# Patient Record
Sex: Female | Born: 1980 | Hispanic: No | Marital: Single | Smoking: Never smoker
Health system: Southern US, Community
[De-identification: ages and names within clinical notes are randomized; demographics above are authoritative.]

## PROBLEM LIST (undated history)

## (undated) DIAGNOSIS — D649 Anemia, unspecified: Secondary | ICD-10-CM

---

## 2014-12-17 ENCOUNTER — Ambulatory Visit: Payer: Self-pay

## 2015-02-26 ENCOUNTER — Ambulatory Visit: Payer: Self-pay | Attending: Family Medicine | Admitting: Family Medicine

## 2015-02-26 ENCOUNTER — Encounter: Payer: Self-pay | Admitting: Family Medicine

## 2015-02-26 VITALS — BP 99/62 | HR 81 | Temp 98.0°F | Resp 16 | Ht 62.5 in | Wt 152.0 lb

## 2015-02-26 DIAGNOSIS — R51 Headache: Secondary | ICD-10-CM | POA: Insufficient documentation

## 2015-02-26 DIAGNOSIS — E559 Vitamin D deficiency, unspecified: Secondary | ICD-10-CM | POA: Insufficient documentation

## 2015-02-26 DIAGNOSIS — R102 Pelvic and perineal pain: Secondary | ICD-10-CM | POA: Insufficient documentation

## 2015-02-26 DIAGNOSIS — N926 Irregular menstruation, unspecified: Secondary | ICD-10-CM | POA: Insufficient documentation

## 2015-02-26 DIAGNOSIS — J3489 Other specified disorders of nose and nasal sinuses: Secondary | ICD-10-CM | POA: Insufficient documentation

## 2015-02-26 DIAGNOSIS — Z124 Encounter for screening for malignant neoplasm of cervix: Secondary | ICD-10-CM

## 2015-02-26 DIAGNOSIS — Z975 Presence of (intrauterine) contraceptive device: Secondary | ICD-10-CM | POA: Insufficient documentation

## 2015-02-26 DIAGNOSIS — Z01419 Encounter for gynecological examination (general) (routine) without abnormal findings: Secondary | ICD-10-CM | POA: Insufficient documentation

## 2015-02-26 DIAGNOSIS — R42 Dizziness and giddiness: Secondary | ICD-10-CM | POA: Insufficient documentation

## 2015-02-26 LAB — POCT URINALYSIS DIPSTICK
Bilirubin, UA: NEGATIVE
Glucose, UA: NEGATIVE
Ketones, UA: NEGATIVE
LEUKOCYTES UA: NEGATIVE
NITRITE UA: NEGATIVE
PH UA: 6.5
PROTEIN UA: NEGATIVE
Spec Grav, UA: 1.025
UROBILINOGEN UA: 0.2

## 2015-02-26 LAB — CBC
HCT: 42.3 % (ref 36.0–46.0)
Hemoglobin: 15 g/dL (ref 12.0–15.0)
MCH: 30.5 pg (ref 26.0–34.0)
MCHC: 35.5 g/dL (ref 30.0–36.0)
MCV: 86.2 fL (ref 78.0–100.0)
MPV: 11.3 fL (ref 8.6–12.4)
PLATELETS: 248 10*3/uL (ref 150–400)
RBC: 4.91 MIL/uL (ref 3.87–5.11)
RDW: 13.4 % (ref 11.5–15.5)
WBC: 10.4 10*3/uL (ref 4.0–10.5)

## 2015-02-26 LAB — POCT URINE PREGNANCY: PREG TEST UR: NEGATIVE

## 2015-02-26 LAB — POCT GLYCOSYLATED HEMOGLOBIN (HGB A1C): HEMOGLOBIN A1C: 5.4

## 2015-02-26 NOTE — Progress Notes (Signed)
Patient ID: Kayla Stevenson, female   DOB: Mar 26, 1981, 34 y.o.   MRN: [id number]   Subjective:  Patient ID: Kayla Stevenson, female    DOB: Mar 26, 1981  Age: 34 y.o. MRN: [id number] Spanish interpreter used  CC: Establish Care   HPI Kayla Stevenson presents for    1. Dizzy: started two weeks ago. Intermittent dizziness. Last felt dizzy two days ago. Dizziness last a couple of minutes. She has no history of anemia.   2. Menstrual irregularities: since IUD placement. Present for two years. spotting's comes and goes. It is light. It last 3 days at a time. Has slight pelvic pain at times as well.   3. Dry nose with trouble breathing: comes and goes when she eats. Makes her sniff and sniffle. No abdominal pain or CP. No reflux.   4. Headache: L sided. Most days. Moderate pain. ? Migraines. No treatment.   History reviewed. No pertinent past medical history.  History reviewed. No pertinent past surgical history.  Family History  Problem Relation Age of Onset  . Depression Mother     Social History  Substance Use Topics  . Smoking status: Never Smoker   . Smokeless tobacco: Not on file  . Alcohol Use: No    ROS Review of Systems  Constitutional: Negative for fever and chills.  Eyes: Negative for visual disturbance.  Respiratory: Positive for shortness of breath.   Cardiovascular: Negative for chest pain.  Gastrointestinal: Negative for abdominal pain and blood in stool.  Genitourinary: Positive for menstrual problem and pelvic pain.  Musculoskeletal: Negative for back pain and arthralgias.  Skin: Negative for rash.  Allergic/Immunologic: Negative for immunocompromised state.  Neurological: Positive for dizziness and headaches.  Hematological: Negative for adenopathy. Does not bruise/bleed easily.  Psychiatric/Behavioral: Negative for suicidal ideas and dysphoric mood.    Objective:   Today's Vitals: BP 99/62 mmHg  Pulse 81  Temp(Src) 98 F (36.7 C) (Oral)  Resp 16  Ht  5' 2.5" (1.588 m)  Wt 152 lb (68.947 kg)  BMI 27.34 kg/m2  SpO2 98%  LMP 01/12/2015 Orthostatic VS for the past 24 hrs:  BP- Lying Pulse- Lying BP- Sitting Pulse- Sitting BP- Standing at 0 minutes Pulse- Standing at 0 minutes  02/26/15 1655 110/70 mmHg 65 115/76 mmHg 75 120/83 mmHg 76    Physical Exam  Constitutional: She is oriented to person, place, and time. She appears well-developed and well-nourished. No distress.  HENT:  Head: Normocephalic and atraumatic.  Cardiovascular: Normal rate, regular rhythm, normal heart sounds and intact distal pulses.   Pulmonary/Chest: Effort normal and breath sounds normal.  Genitourinary: Vagina normal and uterus normal. Pelvic exam was performed with patient prone. There is no rash, tenderness or lesion on the right labia. There is no rash, tenderness or lesion on the left labia. Cervix exhibits no motion tenderness, no discharge and no friability.  IUD strings visualized   Musculoskeletal: She exhibits no edema.  Lymphadenopathy:       Right: No inguinal adenopathy present.       Left: No inguinal adenopathy present.  Neurological: She is alert and oriented to person, place, and time.  Skin: Skin is warm and dry. No rash noted.  Psychiatric: She has a normal mood and affect.   Normal UA and negative U preg  Assessment & Plan:   Kayla Stevenson was seen today for establish care.  Diagnoses and all orders for this visit:  Dizzy -     CBC -     Vitamin D,  25-hydroxy -     HgB A1c  Pelvic pain in female -     POCT urinalysis dipstick -     POCT urine pregnancy  Pap smear for cervical cancer screening -     Cytology - PAP [place]     No outpatient encounter prescriptions on file as of 02/26/2015.   No facility-administered encounter medications on file as of 02/26/2015.    Follow-up: No Follow-up on file.    Dessa Phi MD

## 2015-02-26 NOTE — Patient Instructions (Addendum)
Verlee MonteDora was seen today for establish care.  Diagnoses and all orders for this visit:  Dizzy -     CBC -     Vitamin D, 25-hydroxy -     HgB A1c  Pelvic pain in female -     POCT urinalysis dipstick -     POCT urine pregnancy  Pap smear for cervical cancer screening -     Cytology - PAP    You will be called with results   F/u in 3 months for dizziness. F/u sooner if you would like to change birth control   Dr. Armen PickupFunches

## 2015-02-26 NOTE — Progress Notes (Signed)
Establish Care  C/C Dizziness. Stated after eating feeling SHOB, low abdominal pain  Pain scale 6 No tobacco user  No suicide thought in the past two weeks

## 2015-02-27 ENCOUNTER — Telehealth: Payer: Self-pay | Admitting: *Deleted

## 2015-02-27 DIAGNOSIS — E559 Vitamin D deficiency, unspecified: Secondary | ICD-10-CM | POA: Insufficient documentation

## 2015-02-27 LAB — VITAMIN D 25 HYDROXY (VIT D DEFICIENCY, FRACTURES): Vit D, 25-Hydroxy: 12 ng/mL — ABNORMAL LOW (ref 30–100)

## 2015-02-27 MED ORDER — VITAMIN D (ERGOCALCIFEROL) 1.25 MG (50000 UNIT) PO CAPS: 50000.0000 [IU] | ORAL_CAPSULE | ORAL | Status: DC

## 2015-02-27 NOTE — Addendum Note (Signed)
Addended by: Dessa PhiFUNCHES, [place] on: 02/27/2015 09:08 AM   Modules accepted: Orders

## 2015-02-27 NOTE — Telephone Encounter (Signed)
-----   Message from Dessa PhiJosalyn Funches, MD sent at 02/27/2015  9:07 AM EST ----- Normal CBC Vit D deficiency will replete

## 2015-02-27 NOTE — Telephone Encounter (Signed)
Date of birth verified by pt Lab results given Normal CBC Vit D deficiency  Rx Vit D send to [facility]  Pt verbalized understanding  Information given in Spanish

## 2015-02-28 LAB — CERVICOVAGINAL ANCILLARY ONLY
Chlamydia: NEGATIVE
NEISSERIA GONORRHEA: NEGATIVE
Wet Prep (BD Affirm): NEGATIVE

## 2015-02-28 LAB — CYTOLOGY - PAP

## 2015-03-06 ENCOUNTER — Telehealth: Payer: Self-pay | Admitting: *Deleted

## 2015-03-06 NOTE — Telephone Encounter (Signed)
-----   Message from Dessa PhiJosalyn Funches, MD sent at 03/01/2015  3:48 PM EST ----- Negative pap Repeat in 3 years

## 2015-03-06 NOTE — Telephone Encounter (Signed)
-----   Message from Josalyn Funches, MD sent at 02/28/2015  9:04 AM EST ----- Negative GC/chlamydia 

## 2015-03-06 NOTE — Telephone Encounter (Signed)
Date of birth verified by pt  Results given  Normal pap smear repeat in 3 years Negative CG/Chlamydia and negative wep prep Pt verbalized understanding  Results given in Spanish

## 2015-03-06 NOTE — Telephone Encounter (Signed)
-----   Message from Dessa PhiJosalyn Funches, MD sent at 02/28/2015  8:51 AM EST ----- Negative wet prep

## 2015-05-29 ENCOUNTER — Ambulatory Visit: Payer: Self-pay | Attending: Family Medicine

## 2015-10-04 ENCOUNTER — Encounter (HOSPITAL_COMMUNITY): Payer: Self-pay | Admitting: *Deleted

## 2015-10-07 ENCOUNTER — Other Ambulatory Visit (HOSPITAL_COMMUNITY): Payer: Self-pay | Admitting: *Deleted

## 2015-10-07 DIAGNOSIS — N6320 Unspecified lump in the left breast, unspecified quadrant: Secondary | ICD-10-CM

## 2015-10-31 ENCOUNTER — Other Ambulatory Visit (HOSPITAL_COMMUNITY): Payer: Self-pay | Admitting: *Deleted

## 2015-10-31 ENCOUNTER — Ambulatory Visit
Admission: RE | Admit: 2015-10-31 | Discharge: 2015-10-31 | Disposition: A | Discharge status: Home / Self Care | Payer: No Typology Code available for payment source | Source: Ambulatory Visit | Attending: Obstetrics and Gynecology | Admitting: Obstetrics and Gynecology

## 2015-10-31 ENCOUNTER — Encounter (HOSPITAL_COMMUNITY): Payer: Self-pay

## 2015-10-31 ENCOUNTER — Ambulatory Visit (HOSPITAL_COMMUNITY)
Admission: RE | Admit: 2015-10-31 | Discharge: 2015-10-31 | Disposition: A | Discharge status: Home / Self Care | Payer: Self-pay | Source: Ambulatory Visit | Attending: Obstetrics and Gynecology | Admitting: Obstetrics and Gynecology

## 2015-10-31 ENCOUNTER — Other Ambulatory Visit (HOSPITAL_COMMUNITY): Payer: Self-pay | Admitting: Obstetrics and Gynecology

## 2015-10-31 ENCOUNTER — Ambulatory Visit
Admission: RE | Admit: 2015-10-31 | Discharge: 2015-10-31 | Disposition: A | Discharge status: Home / Self Care | Payer: Self-pay | Source: Ambulatory Visit | Attending: Obstetrics and Gynecology | Admitting: Obstetrics and Gynecology

## 2015-10-31 VITALS — BP 110/64 | Temp 97.9°F | Ht 63.0 in | Wt 150.2 lb

## 2015-10-31 DIAGNOSIS — N6320 Unspecified lump in the left breast, unspecified quadrant: Secondary | ICD-10-CM

## 2015-10-31 DIAGNOSIS — N6325 Unspecified lump in the left breast, overlapping quadrants: Secondary | ICD-10-CM

## 2015-10-31 DIAGNOSIS — N6452 Nipple discharge: Secondary | ICD-10-CM

## 2015-10-31 DIAGNOSIS — Z1239 Encounter for other screening for malignant neoplasm of breast: Secondary | ICD-10-CM

## 2015-10-31 HISTORY — DX: Anemia, unspecified: D64.9

## 2015-10-31 NOTE — Patient Instructions (Signed)
Explained breast self awareness to Kayla Stevenson. Let patient know that she did not need a Pap smear today due to last Pap smear and HPV typing was 02/26/2015. Let her know BCCCP will cover Pap smears and HPV typing every 5 years unless has a history of abnormal Pap smears. Referred patient to the [facility] for diagnostic mammogram. Appointment scheduled for Thursday, October 31, 2015 at 1530. Let patient know will follow up with her within the next couple weeks with results of breast discharge by phone. Kayla Stevenson verbalized understanding.  Jin Shockley, Kathaleen Maser, RN 4:17 PM

## 2015-10-31 NOTE — Progress Notes (Signed)
Complaints of left breast pain that comes and goes x 8 months. Patient rates pain at a 8 out of 10. Patient complained of bilateral milky breast discharge x 3 years that at times is spontaneous.   Pap Smear: Pap smear not completed today. Last Pap smear was 02/26/2015 at the [facility] that was normal with negative HPV. Per patient has no history of an abnormal Pap smear.  Physical exam: Breasts Breasts symmetrical. No skin abnormalities bilateral breasts. No nipple retraction bilateral breasts. Bilateral milky colored breast discharge. Sample of bilateral breast discharge sent to cytology. No lymphadenopathy. No lumps palpated right breast. Palpated a pea sized left breast lump at 3 o'clock 4 cm from the nipple. No complaints of pain or tenderness on exam. Referred patient to the [facility] for diagnostic mammogram. Appointment scheduled for Thursday, October 31, 2015 at 1530.   Pelvic/Bimanual No Pap smear completed today since last Pap smear and HPV typing was 02/26/2015. Pap smear not indicated per BCCCP guidelines.   Smoking History: Patient has never smoked.  Patient Navigation: Patient education provided. Access to services provided for patient through [facility] program. Spanish interpreter provided.  Used Spanish interpreter Melissa from the Breast Center.

## 2015-11-04 ENCOUNTER — Encounter (HOSPITAL_COMMUNITY): Payer: Self-pay | Admitting: *Deleted

## 2016-02-03 ENCOUNTER — Ambulatory Visit: Payer: No Typology Code available for payment source | Admitting: Family Medicine

## 2016-04-02 ENCOUNTER — Encounter: Payer: Self-pay | Admitting: Physician Assistant

## 2016-04-02 ENCOUNTER — Ambulatory Visit: Payer: Self-pay | Attending: Family Medicine | Admitting: Physician Assistant

## 2016-04-02 VITALS — BP 109/67 | HR 76 | Temp 97.9°F | Resp 16 | Wt 149.4 lb

## 2016-04-02 DIAGNOSIS — D649 Anemia, unspecified: Secondary | ICD-10-CM | POA: Insufficient documentation

## 2016-04-02 DIAGNOSIS — R42 Dizziness and giddiness: Secondary | ICD-10-CM | POA: Insufficient documentation

## 2016-04-02 DIAGNOSIS — R110 Nausea: Secondary | ICD-10-CM | POA: Insufficient documentation

## 2016-04-02 DIAGNOSIS — Z79899 Other long term (current) drug therapy: Secondary | ICD-10-CM | POA: Insufficient documentation

## 2016-04-02 MED ORDER — PROMETHAZINE HCL 25 MG PO TABS
ORAL_TABLET | ORAL | 0 refills | Status: DC
Start: 2016-04-02 — End: 2017-06-18

## 2016-04-02 MED ORDER — FLUTICASONE PROPIONATE 50 MCG/ACT NA SUSP: 2.0000 | Freq: Every day | NASAL | 6 refills | Status: DC

## 2016-04-02 MED FILL — PROMETHAZINE 25 MG TABLET: 25 | 4 days supply | Qty: 20 | Fill #0

## 2016-04-02 MED FILL — FLUTICASONE PROP 50 MCG SPR: 50 | 30 days supply | Qty: 16 | Fill #0

## 2016-04-02 NOTE — Progress Notes (Signed)
Kayla Stevenson, is a 36 y.o. female  RUE:[id number]  JYN:[id number]  DOB - [birth year]  Subjective:  Chief Complaint and HPI: Kayla Stevenson is a 36 y.o. female here today for 2-3 day h/o dizziness, nausea, nasal congestion, and slight cough.  She had a few loose stools a few days ago and that has resolved.   Her baby is also sick.  She denies abdominal pain, urinary s/sx. She is on her period now. No vision changes.  [employer] interpreters used.    ROS:   Constitutional:  No f/c, No night sweats, No unexplained weight loss. EENT:  No vision changes, No blurry vision, No hearing changes. +sinus congestion Respiratory: Mild cough, No SOB Cardiac: No CP, no palpitations GI:  No abd pain, + nausea w/o vomiting, some loose BM that have resolved.  GU: No Urinary s/sx Musculoskeletal: No joint pain Neuro: No headache, + dizziness, no motor weakness.  Skin: No rash Endocrine:  No polydipsia. No polyuria.  Psych: Denies SI/HI  No problems updated.  ALLERGIES: No Known Allergies  PAST MEDICAL HISTORY: Past Medical History:  Diagnosis Date  . Anemia     MEDICATIONS AT HOME: Prior to Admission medications   Medication Sig Start Date End Date Taking? Authorizing Provider  levonorgestrel (MIRENA) 20 MCG/24HR IUD 1 each by Intrauterine route once.   Yes Historical Provider, MD  fluticasone (FLONASE) 50 MCG/ACT nasal spray Place 2 sprays into both nostrils daily. 04/02/16   Anders SimmondsAngela M Anaisabel Pederson, PA-C  promethazine (PHENERGAN) 25 MG tablet 1/2-1 tablet every 4-6 hours for nausea or dizziness 04/02/16   Anders SimmondsAngela M Jackquelyn Sundberg, PA-C  Vitamin D, Ergocalciferol, (DRISDOL) 50000 UNITS CAPS capsule Take 1 capsule (50,000 Units total) by mouth every 7 (seven) days. For 8 weeks Patient not taking: Reported on 10/31/2015 02/27/15   Dessa PhiJosalyn Funches, MD     Objective:  EXAM:   Vitals:   04/02/16 1637  BP: 109/67  Pulse: 76  Resp: 16  Temp: 97.9 F (36.6 C)  TempSrc: Oral  SpO2: 98%  Weight: 149 lb 6.4  oz (67.8 kg)    General appearance : A&OX3. NAD. Non-toxic-appearing HEENT: Atraumatic and Normocephalic.  PERRLA. EOM intact.  TM clear B. Mouth-MMM, post pharynx WNL w/o erythema, No PND. Neck: supple, no JVD. No cervical lymphadenopathy. No thyromegaly Chest/Lungs:  Breathing-non-labored, Good air entry bilaterally, breath sounds normal without rales, rhonchi, or wheezing  CVS: S1 S2 regular, no murmurs, gallops, rubs  Abdomen: Bowel sounds present, Non tender and not distended with no gaurding, rigidity or rebound. Extremities: Bilateral Lower Ext shows no edema, both legs are warm to touch with = pulse throughout Neurology:  CN II-XII grossly intact, Non focal.   Psych:  TP linear. J/I WNL. Normal speech. Appropriate eye contact and affect.  Skin:  No Rash  Data Review Lab Results  Component Value Date   HGBA1C 5.40 02/26/2015     Assessment & Plan   1. Dizzy and Nausea without vomiting and congestion Likely viral syndrome due to multiple s/sx - promethazine (PHENERGAN) 25 MG tablet; 1/2-1 tablet every 4-6 hours for nausea or dizziness  Dispense: 20 tablet; Refill: 0 Flonase- Fluids, rest, adequate hydration    Patient have been counseled extensively about nutrition and exercise  Return in about 2 months (around 05/31/2016) for Schedule CPE with Dr Armen PickupFunches.  The patient was given clear instructions to go to ER or return to medical center if symptoms don't improve, worsen or new problems develop. The patient verbalized understanding. The patient  was told to call to get lab results if they haven't heard anything in the next week.     Georgian Co, PA-C [facility] [place], [place] [phone]   04/02/2016, 4:47 PM

## 2016-07-01 ENCOUNTER — Ambulatory Visit: Payer: Self-pay | Attending: Family Medicine

## 2016-08-12 ENCOUNTER — Encounter: Payer: Self-pay | Admitting: Family Medicine

## 2017-06-18 ENCOUNTER — Other Ambulatory Visit: Payer: Self-pay

## 2017-06-18 ENCOUNTER — Encounter (HOSPITAL_COMMUNITY): Payer: Self-pay | Admitting: Emergency Medicine

## 2017-06-18 ENCOUNTER — Ambulatory Visit (HOSPITAL_COMMUNITY)
Admission: EM | Admit: 2017-06-18 | Discharge: 2017-06-18 | Disposition: A | Discharge status: Home / Self Care | Payer: Self-pay | Attending: Family Medicine | Admitting: Family Medicine

## 2017-06-18 DIAGNOSIS — N719 Inflammatory disease of uterus, unspecified: Secondary | ICD-10-CM | POA: Insufficient documentation

## 2017-06-18 LAB — POCT URINALYSIS DIP (DEVICE)
Bilirubin Urine: NEGATIVE
GLUCOSE, UA: NEGATIVE mg/dL
Ketones, ur: NEGATIVE mg/dL
Leukocytes, UA: NEGATIVE
Nitrite: NEGATIVE
PROTEIN: NEGATIVE mg/dL
SPECIFIC GRAVITY, URINE: 1.02 (ref 1.005–1.030)
UROBILINOGEN UA: 0.2 mg/dL (ref 0.0–1.0)
pH: 6.5 (ref 5.0–8.0)

## 2017-06-18 MED ORDER — DOXYCYCLINE HYCLATE 100 MG PO TABS: 100.0000 mg | ORAL_TABLET | Freq: Two times a day (BID) | ORAL | 0 refills | Status: DC

## 2017-06-18 MED ORDER — METRONIDAZOLE 500 MG PO TABS: 500.0000 mg | ORAL_TABLET | Freq: Two times a day (BID) | ORAL | 0 refills | Status: DC

## 2017-06-18 NOTE — ED Provider Notes (Signed)
[facility]   [id number] 06/18/17 Arrival Time: 0958   SUBJECTIVE:  Kayla Stevenson is a 37 y.o. female who presents to the urgent care with complaint of persistent menses x 2 weeks with abdominal pressure.  No fever.  G0P0.  Has sexual partner, relies on Mirena x 5 years.  She has low back pain and has passed clots last night.  Patient works Education officer, environmental [employer].  Past Medical History:  Diagnosis Date  . Anemia    History reviewed. No pertinent family history. Social History   Socioeconomic History  . Marital status: Single    Spouse name: Not on file  . Number of children: Not on file  . Years of education: Not on file  . Highest education level: Not on file  Occupational History  . Not on file  Social Needs  . Financial resource strain: Not on file  . Food insecurity:    Worry: Not on file    Inability: Not on file  . Transportation needs:    Medical: Not on file    Non-medical: Not on file  Tobacco Use  . Smoking status: Never Smoker  . Smokeless tobacco: Never Used  Substance and Sexual Activity  . Alcohol use: No    Alcohol/week: 0.0 oz  . Drug use: No  . Sexual activity: Yes    Birth control/protection: IUD  Lifestyle  . Physical activity:    Days per week: Not on file    Minutes per session: Not on file  . Stress: Not on file  Relationships  . Social connections:    Talks on phone: Not on file    Gets together: Not on file    Attends religious service: Not on file    Active member of club or organization: Not on file    Attends meetings of clubs or organizations: Not on file    Relationship status: Not on file  . Intimate partner violence:    Fear of current or ex partner: Not on file    Emotionally abused: Not on file    Physically abused: Not on file    Forced sexual activity: Not on file  Other Topics Concern  . Not on file  Social History Narrative  . Not on file   Current Meds  Medication Sig  . acetaminophen (TYLENOL) 325 MG  tablet Take 650 mg by mouth every 6 (six) hours as needed.   No Known Allergies    ROS: As per HPI, remainder of ROS negative.   OBJECTIVE:   Vitals:   06/18/17 1018  BP: 124/72  Pulse: 71  Temp: 98.2 F (36.8 C)  TempSrc: Oral  SpO2: 100%     General appearance: alert; no distress Eyes: PERRL; EOMI; conjunctiva normal HENT: normocephalic; atraumatic; ; oral mucosa normal Neck: supple Lungs: clear to auscultation bilaterally Heart: regular rate and rhythm Abdomen: soft, mild suprapubictender; bowel sounds normal; no masses or organomegaly; no guarding or rebound tenderness Pelvic:  Normal ext genitalia, vagina shows scant clotted menstrual type blood, cervix normal with mirena strings present, utx normal sized with some tenderness, no adenxal mass or tenderness Back: no CVA tenderness Extremities: no cyanosis or edema; symmetrical with no gross deformities Skin: warm and dry Neurologic: normal gait; grossly normal Psychological: alert and cooperative; normal mood and affect      Labs:  Results for orders placed or performed during the hospital encounter of 06/18/17  POCT urinalysis dip (device)  Result Value Ref Range   Glucose, UA  NEGATIVE NEGATIVE mg/dL   Bilirubin Urine NEGATIVE NEGATIVE   Ketones, ur NEGATIVE NEGATIVE mg/dL   Specific Gravity, Urine 1.020 1.005 - 1.030   Hgb urine dipstick TRACE (A) NEGATIVE   pH 6.5 5.0 - 8.0   Protein, ur NEGATIVE NEGATIVE mg/dL   Urobilinogen, UA 0.2 0.0 - 1.0 mg/dL   Nitrite NEGATIVE NEGATIVE   Leukocytes, UA NEGATIVE NEGATIVE    Labs Reviewed  POCT URINALYSIS DIP (DEVICE) - Abnormal; Notable for the following components:      Result Value   Hgb urine dipstick TRACE (*)    All other components within normal limits  URINE CYTOLOGY ANCILLARY ONLY    No results found.     ASSESSMENT & PLAN:  1. Endometritis     Meds ordered this encounter  Medications  . doxycycline (VIBRA-TABS) 100 MG tablet    Sig:  Take 1 tablet (100 mg total) by mouth 2 (two) times daily.    Dispense:  20 tablet    Refill:  0  . metroNIDAZOLE (FLAGYL) 500 MG tablet    Sig: Take 1 tablet (500 mg total) by mouth 2 (two) times daily.    Dispense:  14 tablet    Refill:  0    Reviewed expectations re: course of current medical issues. Questions answered. Outlined signs and symptoms indicating need for more acute intervention. Patient verbalized understanding. After Visit Summary given.    Procedures:      Elvina SidleLauenstein, Raelin Pixler, MD 06/18/17 1056

## 2017-06-18 NOTE — ED Notes (Signed)
Pt could not void. Water given

## 2017-06-18 NOTE — ED Triage Notes (Signed)
States has had menses x 2 weeks

## 2017-06-18 NOTE — ED Triage Notes (Addendum)
C/o low back pain onset 2 weeks, no known injury. States having abdominal pain and pressure denies dysuria. States has had menses x 2 weeks and last PM in shower had blood clot come out of vagina

## 2017-06-21 LAB — URINE CYTOLOGY ANCILLARY ONLY
Chlamydia: NEGATIVE
Neisseria Gonorrhea: NEGATIVE
Trichomonas: NEGATIVE

## 2017-06-22 LAB — URINE CYTOLOGY ANCILLARY ONLY
Bacterial vaginitis: NEGATIVE
Candida vaginitis: NEGATIVE

## 2017-07-01 ENCOUNTER — Emergency Department (HOSPITAL_COMMUNITY)
Admission: EM | Admit: 2017-07-01 | Discharge: 2017-07-02 | Disposition: A | Discharge status: Home / Self Care | Payer: Self-pay | Attending: Emergency Medicine | Admitting: Emergency Medicine

## 2017-07-01 ENCOUNTER — Other Ambulatory Visit: Payer: Self-pay

## 2017-07-01 ENCOUNTER — Encounter (HOSPITAL_COMMUNITY): Payer: Self-pay | Admitting: Emergency Medicine

## 2017-07-01 DIAGNOSIS — M545 Low back pain: Secondary | ICD-10-CM | POA: Insufficient documentation

## 2017-07-01 DIAGNOSIS — Z30432 Encounter for removal of intrauterine contraceptive device: Secondary | ICD-10-CM | POA: Insufficient documentation

## 2017-07-01 DIAGNOSIS — G8929 Other chronic pain: Secondary | ICD-10-CM | POA: Insufficient documentation

## 2017-07-01 DIAGNOSIS — R102 Pelvic and perineal pain: Secondary | ICD-10-CM

## 2017-07-01 DIAGNOSIS — N739 Female pelvic inflammatory disease, unspecified: Secondary | ICD-10-CM | POA: Insufficient documentation

## 2017-07-01 DIAGNOSIS — Z79899 Other long term (current) drug therapy: Secondary | ICD-10-CM | POA: Insufficient documentation

## 2017-07-01 LAB — URINALYSIS, ROUTINE W REFLEX MICROSCOPIC
BACTERIA UA: NONE SEEN
BILIRUBIN URINE: NEGATIVE
Glucose, UA: 50 mg/dL — AB
KETONES UR: 5 mg/dL — AB
Leukocytes, UA: NEGATIVE
Nitrite: NEGATIVE
PROTEIN: NEGATIVE mg/dL
Specific Gravity, Urine: 1.026 (ref 1.005–1.030)
pH: 7 (ref 5.0–8.0)

## 2017-07-01 LAB — CBC WITH DIFFERENTIAL/PLATELET
BASOS PCT: 0 %
Basophils Absolute: 0 10*3/uL (ref 0.0–0.1)
Eosinophils Absolute: 0 10*3/uL (ref 0.0–0.7)
Eosinophils Relative: 0 %
HEMATOCRIT: 41.3 % (ref 36.0–46.0)
HEMOGLOBIN: 14.5 g/dL (ref 12.0–15.0)
LYMPHS ABS: 1.9 10*3/uL (ref 0.7–4.0)
Lymphocytes Relative: 13 %
MCH: 30.5 pg (ref 26.0–34.0)
MCHC: 35.1 g/dL (ref 30.0–36.0)
MCV: 86.9 fL (ref 78.0–100.0)
MONOS PCT: 2 %
Monocytes Absolute: 0.3 10*3/uL (ref 0.1–1.0)
NEUTROS ABS: 12 10*3/uL — AB (ref 1.7–7.7)
NEUTROS PCT: 85 %
Platelets: 267 10*3/uL (ref 150–400)
RBC: 4.75 MIL/uL (ref 3.87–5.11)
RDW: 13.1 % (ref 11.5–15.5)
WBC: 14.3 10*3/uL — ABNORMAL HIGH (ref 4.0–10.5)

## 2017-07-01 LAB — COMPREHENSIVE METABOLIC PANEL
ALT: 22 U/L (ref 14–54)
ANION GAP: 10 (ref 5–15)
AST: 20 U/L (ref 15–41)
Albumin: 3.9 g/dL (ref 3.5–5.0)
Alkaline Phosphatase: 62 U/L (ref 38–126)
BUN: 14 mg/dL (ref 6–20)
CHLORIDE: 104 mmol/L (ref 101–111)
CO2: 22 mmol/L (ref 22–32)
Calcium: 9.3 mg/dL (ref 8.9–10.3)
Creatinine, Ser: 0.61 mg/dL (ref 0.44–1.00)
GFR calc non Af Amer: >60 mL/min (ref >60)
Glucose, Bld: 150 mg/dL — ABNORMAL HIGH (ref 65–99)
Potassium: 3.6 mmol/L (ref 3.5–5.1)
SODIUM: 136 mmol/L (ref 135–145)
Total Bilirubin: 0.4 mg/dL (ref 0.3–1.2)
Total Protein: 6.6 g/dL (ref 6.5–8.1)

## 2017-07-01 LAB — PREGNANCY, URINE: PREG TEST UR: NEGATIVE

## 2017-07-01 MED ORDER — IBUPROFEN 200 MG PO TABS
600.0000 mg | ORAL_TABLET | Freq: Once | ORAL | Status: AC
  Administered 2017-07-01: 600 mg via ORAL
  Filled 2017-07-01: qty 1

## 2017-07-01 NOTE — ED Triage Notes (Addendum)
Pt to ED with c/o lower abd pain and lower back pain for several days.  St;'s her MD told her she had an infection from her mirena

## 2017-07-01 NOTE — ED Provider Notes (Signed)
Patient placed in Quick Look pathway, seen and evaluated   Chief Complaint: Pelvic pain  HPI:   Reports lower abdominal pain, back pain and pelvic pain for the past few days.  Nausea with out diarrhea. Told has infection from [name].    ROS: Vaginal spotting, no fevers (one)  Physical Exam:   Gen: No distress  Neuro: Awake and Alert  Skin: Warm    Focused Exam: Lower abdomen TTP.     Initiation of care has begun. The patient has been counseled on the process, plan, and necessity for staying for the completion/evaluation, and the remainder of the medical screening examination    Kayla Stevenson, Kayla Stevenson W, PA-C 07/01/17 1716    Derwood KaplanNanavati, Ankit, MD 07/03/17 [phone]

## 2017-07-02 ENCOUNTER — Emergency Department (HOSPITAL_COMMUNITY): Payer: Self-pay

## 2017-07-02 LAB — WET PREP, GENITAL
CLUE CELLS WET PREP: NONE SEEN
Sperm: NONE SEEN
TRICH WET PREP: NONE SEEN
YEAST WET PREP: NONE SEEN

## 2017-07-02 LAB — URINE CULTURE: Culture: <10000 — AB

## 2017-07-02 MED ORDER — TRAMADOL HCL 50 MG PO TABS: 50.0000 mg | ORAL_TABLET | Freq: Four times a day (QID) | ORAL | 0 refills | Status: DC | PRN

## 2017-07-02 MED ORDER — OXYCODONE-ACETAMINOPHEN 5-325 MG PO TABS
1.0000 | ORAL_TABLET | Freq: Once | ORAL | Status: AC
  Administered 2017-07-02: 1 via ORAL
  Filled 2017-07-02: qty 1

## 2017-07-02 MED ORDER — IBUPROFEN 400 MG PO TABS
600.0000 mg | ORAL_TABLET | Freq: Once | ORAL | Status: AC
  Administered 2017-07-02: 600 mg via ORAL
  Filled 2017-07-02: qty 1

## 2017-07-02 MED ORDER — ONDANSETRON 4 MG PO TBDP
4.0000 mg | ORAL_TABLET | Freq: Once | ORAL | Status: AC
  Administered 2017-07-02: 4 mg via ORAL
  Filled 2017-07-02: qty 1

## 2017-07-02 MED ORDER — DOXYCYCLINE HYCLATE 100 MG PO CAPS: 100.0000 mg | ORAL_CAPSULE | Freq: Two times a day (BID) | ORAL | 0 refills | Status: DC

## 2017-07-02 MED ORDER — LIDOCAINE HCL (PF) 1 % IJ SOLN
INTRAMUSCULAR | Status: AC
  Administered 2017-07-02: 0.9 mL
  Filled 2017-07-02: qty 5

## 2017-07-02 MED ORDER — AZITHROMYCIN 250 MG PO TABS
1000.0000 mg | ORAL_TABLET | Freq: Once | ORAL | Status: AC
  Administered 2017-07-02: 1000 mg via ORAL
  Filled 2017-07-02: qty 4

## 2017-07-02 MED ORDER — CEFTRIAXONE SODIUM 250 MG IJ SOLR
250.0000 mg | Freq: Once | INTRAMUSCULAR | Status: AC
  Administered 2017-07-02: 250 mg via INTRAMUSCULAR
  Filled 2017-07-02: qty 250

## 2017-07-02 NOTE — Discharge Instructions (Signed)
Intrauterine device was removed today. I suspect your pain is from pelvic inflammatory disease. We have treated you with different antibiotics today. [facility] as able. Call and try to make an appointment, Dr. Burnice LoganHarraway has your chart and information. Finish antibiotics as prescribed.   Your back pain is probably not related to your Mirena, this may be from over exertion or muscular injury from work.  For pain, take 1000 mg acetaminophen (tylenol) plus 600 mg ibuprofen (aleve, advil) every 8 hours. If pain is not well controlled, take tramadol (take with miralax and/or stool softener to prevent constipation).   [facility] and try to get an appointment as soon as possible.

## 2017-07-02 NOTE — ED Provider Notes (Signed)
[facility] Provider Note   CSN: [id number] Arrival date & time: 07/01/17  1557     History   Chief Complaint Chief Complaint  Patient presents with  . Abdominal Pain  . Back Pain    HPI Kayla BoysDora Vazquez is a 37 y.o. female here for evaluation of deep vaginal pain that feels like a contraction, intermittent x 1 month, really severe twice.  Pain has radiated to diffuse lower abdomen mostly suprapubic and LLQ the last 3 weeks. Also notes ongoing midline, low, back pain for one month that is constant worse with movement, sneezing, bending down and palpation. She works cleaning houses but denies any specific activity before onset of back pain, falls, saddle anesthesia, numbness or weakness to extremities. [facility] and told she has a pelvic infection from her IUD, has finished flagyl and doxycycline without relief. Mirena IUD inserted 5 years ago, awaiting a physical on 4/10 to be able to then make appointment to get IUD removed. Reports daily, dark blood vaginal spotting for the last 2 years. Long h/o pain with vaginal intercourse. One large clot passed 3 weeks ago.   No fevers, chills, dysuria, hematuria, abnormal vaginal discharge, changes in BM.  Nausea and vomiting once while in ED, but none since, is now hungry.  H/o 3 pregnancies (2 live births, 1 miscarriage, s/p c-section x 2). Sexually active with one female partner without condom use. Remove h/o chlamydia 9 years ago.  HPI  Past Medical History:  Diagnosis Date  . Anemia     Patient Active Problem List   Diagnosis Date Noted  . Vitamin D deficiency 02/27/2015  . Dizzy 02/26/2015  . Pelvic pain in female 02/26/2015    Past Surgical History:  Procedure Laterality Date  . CESAREAN SECTION    . CESAREAN SECTION       OB History    Gravida  4   Para  2   Term  2   Preterm      AB      Living  2     SAB      TAB      Ectopic      Multiple      Live Births  2            Home Medications    Prior to Admission medications   Medication Sig Start Date End Date Taking? Authorizing Provider  levonorgestrel (MIRENA) 20 MCG/24HR IUD 1 each by Intrauterine route once.   Yes [provider]  doxycycline (VIBRAMYCIN) 100 MG capsule Take 1 capsule (100 mg total) by mouth 2 (two) times daily. 07/02/17   Liberty HandyGibbons, Trista Ciocca J, PA-C  metroNIDAZOLE (FLAGYL) 500 MG tablet Take 1 tablet (500 mg total) by mouth 2 (two) times daily. Patient not taking: Reported on 07/02/2017 06/18/17   Elvina SidleLauenstein, Kurt, MD  traMADol (ULTRAM) 50 MG tablet Take 1 tablet (50 mg total) by mouth every 6 (six) hours as needed. 07/02/17   Liberty HandyGibbons, Roshaun Pound J, PA-C    Family History No family history on file.  Social History Social History   Tobacco Use  . Smoking status: Never Smoker  . Smokeless tobacco: Never Used  Substance Use Topics  . Alcohol use: No    Alcohol/week: 0.0 oz  . Drug use: No     Allergies   Patient has no known allergies.   Review of Systems Review of Systems  Genitourinary: Positive for  dyspareunia, menstrual problem, pelvic pain, vaginal bleeding and vaginal pain.  Musculoskeletal: Positive for back pain.  All other systems reviewed and are negative.    Physical Exam Updated Vital Signs BP 117/73   Pulse 78   Temp 98.4 F (36.9 C) (Oral)   Resp 16   Wt 67.6 kg (149 lb)   SpO2 99%   BMI 26.39 kg/m   Physical Exam  Constitutional: She is oriented to person, place, and time. She appears well-developed and well-nourished. No distress.  Non toxic  HENT:  Head: Normocephalic and atraumatic.  Nose: Nose normal.  Mouth/Throat: No oropharyngeal exudate.  Moist mucous membranes   Eyes: Pupils are equal, round, and reactive to light. Conjunctivae and EOM are normal.  Neck: Normal range of motion.  Cardiovascular: Normal rate, regular rhythm and intact distal pulses.  No murmur heard. 2+ DP and radial  pulses bilaterally. No LE edema.   Pulmonary/Chest: Effort normal and breath sounds normal. No respiratory distress. She has no wheezes. She has no rales.  Abdominal: Soft. Bowel sounds are normal. There is tenderness in the suprapubic area.  No G/R/R. No CVA tenderness.   Genitourinary: Rectum normal. Cervix exhibits motion tenderness. Right adnexum displays tenderness. Left adnexum displays tenderness. There is tenderness and bleeding in the vagina.  Genitourinary Comments: External genitalia normal without erythema, edema, tenderness, discharge or lesions.  No groin lymphadenopathy.  Vaginal mucosa and cervix pink without lesions or significant discharge.  +CMT. Non palpable adnexa, tender bilaterally.  +Pain throughout speculum and bimanual exam.   Musculoskeletal: Normal range of motion. She exhibits no deformity.  Neurological: She is alert and oriented to person, place, and time.  Skin: Skin is warm and dry. Capillary refill takes less than 2 seconds.  Psychiatric: She has a normal mood and affect. Her behavior is normal. Judgment and thought content normal.  Nursing note and vitals reviewed.    ED Treatments / Results  Labs (all labs ordered are listed, but only abnormal results are displayed) Labs Reviewed  WET PREP, GENITAL - Abnormal; Notable for the following components:      Result Value   WBC, Wet Prep HPF POC MODERATE (*)    All other components within normal limits  URINALYSIS, ROUTINE W REFLEX MICROSCOPIC - Abnormal; Notable for the following components:   APPearance CLOUDY (*)    Glucose, UA 50 (*)    Hgb urine dipstick SMALL (*)    Ketones, ur 5 (*)    Squamous Epithelial / LPF 0-5 (*)    All other components within normal limits  COMPREHENSIVE METABOLIC PANEL - Abnormal; Notable for the following components:   Glucose, Bld 150 (*)    All other components within normal limits  CBC WITH DIFFERENTIAL/PLATELET - Abnormal; Notable for the following components:    WBC 14.3 (*)    Neutro Abs 12.0 (*)    All other components within normal limits  URINE CULTURE  PREGNANCY, URINE  GC/CHLAMYDIA PROBE AMP ([place]) NOT AT [facility]    EKG None  Radiology Dg Thoracic Spine 2 View  Result Date: 07/02/2017 CLINICAL DATA:  Dorsalgia EXAM: THORACIC SPINE 3 VIEWS COMPARISON:  None. FINDINGS: Frontal, lateral, and swimmer's views were obtained. There is no fracture or spondylolisthesis. Disc spaces appear normal. No erosive change or paraspinous lesion. IMPRESSION: No fracture or spondylolisthesis.  No evident arthropathy. Electronically Signed   By: Bretta Bang III M.D.   On: 07/02/2017 08:14   Dg Lumbar Spine Complete  Result Date: 07/02/2017  CLINICAL DATA:  Lumbago EXAM: LUMBAR SPINE - COMPLETE 4+ VIEW COMPARISON:  None. FINDINGS: Frontal, lateral, spot lumbosacral lateral, and bilateral oblique views were obtained. There are 5 non-rib-bearing lumbar type vertebral bodies. No fracture or spondylolisthesis. The disc spaces appear normal. There is no appreciable facet arthropathy. There is an intrauterine device in the mid-pelvis. IMPRESSION: No fracture or spondylolisthesis. No appreciable arthropathy. Intrauterine device in mid pelvis. Electronically Signed   By: Bretta Bang III M.D.   On: 07/02/2017 08:14   US Transvaginal Non-ob  Result Date: 07/02/2017 CLINICAL DATA:  Left lower quadrant pain EXAM: TRANSABDOMINAL AND TRANSVAGINAL ULTRASOUND OF PELVIS DOPPLER ULTRASOUND OF OVARIES TECHNIQUE: Both transabdominal and transvaginal ultrasound examinations of the pelvis were performed. Transabdominal technique was performed for global imaging of the pelvis including uterus, ovaries, adnexal regions, and pelvic cul-de-sac. It was necessary to proceed with endovaginal exam following the transabdominal exam to visualize the uterus and ovaries. Color and duplex Doppler ultrasound was utilized to evaluate blood flow to the ovaries. COMPARISON:  None. FINDINGS:  Uterus Measurements: 7.4 x 4.9 x 5.0 cm. Intrauterine contraceptive device is not completely visualized, but appears to be in the lower uterine segment. Endometrium Thickness: 4 mm.  No focal abnormality visualized. Right ovary Measurements: 3.0 x 3.3 x 3.5 cm. Normal appearance/no adnexal mass. Left ovary Measurements: 2.2 x 2.2 x 2.7 cm. Normal appearance/no adnexal mass. Pulsed Doppler evaluation of both ovaries demonstrates normal low-resistance arterial and venous waveforms. Other findings No abnormal free fluid. IMPRESSION: 1. Intrauterine contraceptive device may be in the lower uterine segment. 2. No ovarian torsion or other acute abnormality. Electronically Signed   By: Deatra Robinson M.D.   On: 07/02/2017 05:29   US Pelvis Complete  Result Date: 07/02/2017 CLINICAL DATA:  Left lower quadrant pain EXAM: TRANSABDOMINAL AND TRANSVAGINAL ULTRASOUND OF PELVIS DOPPLER ULTRASOUND OF OVARIES TECHNIQUE: Both transabdominal and transvaginal ultrasound examinations of the pelvis were performed. Transabdominal technique was performed for global imaging of the pelvis including uterus, ovaries, adnexal regions, and pelvic cul-de-sac. It was necessary to proceed with endovaginal exam following the transabdominal exam to visualize the uterus and ovaries. Color and duplex Doppler ultrasound was utilized to evaluate blood flow to the ovaries. COMPARISON:  None. FINDINGS: Uterus Measurements: 7.4 x 4.9 x 5.0 cm. Intrauterine contraceptive device is not completely visualized, but appears to be in the lower uterine segment. Endometrium Thickness: 4 mm.  No focal abnormality visualized. Right ovary Measurements: 3.0 x 3.3 x 3.5 cm. Normal appearance/no adnexal mass. Left ovary Measurements: 2.2 x 2.2 x 2.7 cm. Normal appearance/no adnexal mass. Pulsed Doppler evaluation of both ovaries demonstrates normal low-resistance arterial and venous waveforms. Other findings No abnormal free fluid. IMPRESSION: 1. Intrauterine  contraceptive device may be in the lower uterine segment. 2. No ovarian torsion or other acute abnormality. Electronically Signed   By: Deatra Robinson M.D.   On: 07/02/2017 05:29   [place] Art/ven Flow Abd Pelv Doppler  Result Date: 07/02/2017 CLINICAL DATA:  Left lower quadrant pain EXAM: TRANSABDOMINAL AND TRANSVAGINAL ULTRASOUND OF PELVIS DOPPLER ULTRASOUND OF OVARIES TECHNIQUE: Both transabdominal and transvaginal ultrasound examinations of the pelvis were performed. Transabdominal technique was performed for global imaging of the pelvis including uterus, ovaries, adnexal regions, and pelvic cul-de-sac. It was necessary to proceed with endovaginal exam following the transabdominal exam to visualize the uterus and ovaries. Color and duplex Doppler ultrasound was utilized to evaluate blood flow to the ovaries. COMPARISON:  None. FINDINGS: Uterus Measurements: 7.4 x 4.9 x 5.0  cm. Intrauterine contraceptive device is not completely visualized, but appears to be in the lower uterine segment. Endometrium Thickness: 4 mm.  No focal abnormality visualized. Right ovary Measurements: 3.0 x 3.3 x 3.5 cm. Normal appearance/no adnexal mass. Left ovary Measurements: 2.2 x 2.2 x 2.7 cm. Normal appearance/no adnexal mass. Pulsed Doppler evaluation of both ovaries demonstrates normal low-resistance arterial and venous waveforms. Other findings No abnormal free fluid. IMPRESSION: 1. Intrauterine contraceptive device may be in the lower uterine segment. 2. No ovarian torsion or other acute abnormality. Electronically Signed   By: Deatra Robinson M.D.   On: 07/02/2017 05:29    Procedures Procedures (including critical care time)  Medications Ordered in ED Medications  ibuprofen (ADVIL,MOTRIN) tablet 600 mg (600 mg Oral Given 07/01/17 1724)  ibuprofen (ADVIL,MOTRIN) tablet 600 mg (600 mg Oral Given 07/02/17 0248)  oxyCODONE-acetaminophen (PERCOCET/ROXICET) 5-325 MG per tablet 1 tablet (1 tablet Oral Given 07/02/17 0824)  cefTRIAXone  (ROCEPHIN) injection 250 mg (250 mg Intramuscular Given 07/02/17 0954)  azithromycin (ZITHROMAX) tablet 1,000 mg (1,000 mg Oral Given 07/02/17 0953)  lidocaine (PF) (XYLOCAINE) 1 % injection (0.9 mLs  Given 07/02/17 0958)     Initial Impression / Assessment and Plan / ED Course  I have reviewed the triage vital signs and the nursing notes.  Pertinent labs & imaging results that were available during my care of the patient were reviewed by me and considered in my medical decision making (see chart for details).  Clinical Course as of Jul 02 956  Fri Jul 02, 2017  0744 Spoke to OBGYM (Dr. Lauris Poag) who suspects PID, recommending removal of IUD in ED plus azithro 1 g rocephin 250 IM and dc with doxy 100 mg BID for 7 days. Requesting sending pt's information to her so she can f/u.    [CG]    Clinical Course User Index [CG] Liberty Handy, PA-C   ddx includes PID, hemorrhagic cyst.  Less likely endometriosis, torsion.  No signs of systemic infection to suggest TOA. No CVAT or urinary symptoms. Negative pregnancy. Doubt this is GI etiology such as diverticulitis given no changes in BM.  On exam pt HD stable, has suprapubic, LLQ, suprapubic and CM tenderness. She has midline low T-spine and L spine tenderness, reproducible with movement without gross abnormality. No CVAT. I suspect back pain is MSK related, and probably not from pelvic pain/IUD, will treat this conservatively.   Labs and imaging reviewed remarkable for slight leukocytosis WBC 14.3 and moderate WBC on wet prep. US shows IUD lower uterine segment which could be causing discomfort. No cysts or obvious abscess.    Final Clinical Impressions(s) / ED Diagnoses   0945: IUD removed in ED without immediate complications. Patient tolerated procedure well. She was given azithro/rocephin in ED, will be dc with doxy. Close f/u with OBGYN. Have sent Dr. Burnice Logan pt information to ensure close f/u, appreciate her assistance with this  patient. Pt aware. Discussed return precautions.  Final diagnoses:  Pelvic inflammatory disease  Pelvic pain in female  Chronic midline low back pain without sciatica    ED Discharge Orders        Ordered    doxycycline (VIBRAMYCIN) 100 MG capsule  2 times daily     07/02/17 0926    traMADol (ULTRAM) 50 MG tablet  Every 6 hours PRN     07/02/17 0926       Liberty Handy, PA-C 07/02/17 7829    Tegeler, Canary Brim, MD 07/02/17 1843

## 2017-07-02 NOTE — ED Notes (Signed)
Spoke with claudia PA and giving zofran and gingerale

## 2017-07-02 NOTE — ED Notes (Signed)
Pt's nausea is resolved and pt is being discharge. All d/c instructions reviewed by [place] RN with spanish interpreter. VSS.

## 2017-07-02 NOTE — ED Notes (Signed)
Pt after going over discharge and getting ready to leave she states she is nauseated and feels dizzy.  Vital signs retaken and will report to PA.  Pt has not ate since yesterday

## 2017-07-02 NOTE — ED Notes (Signed)
Pt taken to xray 

## 2017-07-05 LAB — GC/CHLAMYDIA PROBE AMP (~~LOC~~) NOT AT ARMC
CHLAMYDIA, DNA PROBE: NEGATIVE
NEISSERIA GONORRHEA: NEGATIVE

## 2017-07-07 ENCOUNTER — Encounter: Payer: Self-pay | Admitting: Obstetrics and Gynecology

## 2017-07-07 ENCOUNTER — Ambulatory Visit (INDEPENDENT_AMBULATORY_CARE_PROVIDER_SITE_OTHER): Payer: Self-pay | Admitting: Obstetrics and Gynecology

## 2017-07-07 ENCOUNTER — Telehealth: Payer: Self-pay | Admitting: *Deleted

## 2017-07-07 VITALS — BP 106/64 | HR 71 | Wt 141.6 lb

## 2017-07-07 DIAGNOSIS — Z789 Other specified health status: Secondary | ICD-10-CM

## 2017-07-07 DIAGNOSIS — R102 Pelvic and perineal pain: Secondary | ICD-10-CM

## 2017-07-07 DIAGNOSIS — Z758 Other problems related to medical facilities and other health care: Secondary | ICD-10-CM | POA: Insufficient documentation

## 2017-07-07 MED ORDER — KETOROLAC TROMETHAMINE 10 MG PO TABS: 10.0000 mg | ORAL_TABLET | Freq: Four times a day (QID) | ORAL | 1 refills | Status: DC | PRN

## 2017-07-07 NOTE — Progress Notes (Signed)
Obstetrics and Gynecology ED Follow up Patient Evaluation  Appointment Date: 07/07/2017  OBGYN Clinic: [facility]  Primary Care Provider: Dessa Phi  Referring Provider: [facility]  Chief Complaint:  Chief Complaint  Patient presents with  . Pelvic Pain    History of Present Illness: Kayla Stevenson is a 37 y.o. Hispanic [id number] (No LMP recorded. (Menstrual status: Irregular Periods).), seen for the above chief complaint.   Patient seen on 4/4 in the ED for pelvic pain, low back pain, nausea.  She states that she had a few days period that wasn't heavy or painful while she had the IUD. It was removed on 4/4 in the ED b/c of her s/s, it was expired (at the 5 year mark) and it looked like it was low; she was also treated with rocephin, azithro and she's finishing her doxy outpatient course. Pain persists and she's had some bleeding since it was removed and rare clots.   UPT, GC/CT/wet prep and ucx were negative in the ED.   Review of Systems:  as noted in the History of Present Illness.  Past Medical History:  Past Medical History:  Diagnosis Date  . Anemia     Past Surgical History:  Past Surgical History:  Procedure Laterality Date  . CESAREAN SECTION    . CESAREAN SECTION      Past Obstetrical History:  OB History  Gravida Para Term Preterm AB Living  4 2 2     2   SAB TAB Ectopic Multiple Live Births          2    # Outcome Date GA Lbr Len/2nd Weight Sex Delivery Anes PTL Lv  4 Gravida           3 Gravida           2 Term           1 Term             Past Gynecological History: As per HPI. 2016 NILM/HPV neg  Social History:  Social History   Socioeconomic History  . Marital status: Single    Spouse name: Not on file  . Number of children: Not on file  . Years of education: Not on file  . Highest education level: Not on file  Occupational History  . Not on file  Social Needs  . Financial resource  strain: Not on file  . Food insecurity:    Worry: Not on file    Inability: Not on file  . Transportation needs:    Medical: Not on file    Non-medical: Not on file  Tobacco Use  . Smoking status: Never Smoker  . Smokeless tobacco: Never Used  Substance and Sexual Activity  . Alcohol use: No    Alcohol/week: 0.0 oz  . Drug use: No  . Sexual activity: Yes    Birth control/protection: IUD  Lifestyle  . Physical activity:    Days per week: Not on file    Minutes per session: Not on file  . Stress: Not on file  Relationships  . Social connections:    Talks on phone: Not on file    Gets together: Not on file    Attends religious service: Not on file    Active member of club or organization: Not on file    Attends meetings of clubs or organizations: Not on file    Relationship status: Not on file  . Intimate partner violence:  Fear of current or ex partner: Not on file    Emotionally abused: Not on file    Physically abused: Not on file    Forced sexual activity: Not on file  Other Topics Concern  . Not on file  Social History Narrative  . Not on file    Family History: History reviewed. No pertinent family history.   Medications Kayla Stevenson had no medications administered during this visit. Current Outpatient Medications  Medication Sig Dispense Refill  . doxycycline (VIBRAMYCIN) 100 MG capsule Take 1 capsule (100 mg total) by mouth 2 (two) times daily. 20 capsule 0  . traMADol (ULTRAM) 50 MG tablet Take 1 tablet (50 mg total) by mouth every 6 (six) hours as needed. 15 tablet 0  . ketorolac (TORADOL) 10 MG tablet Take 1 tablet (10 mg total) by mouth every 6 (six) hours as needed. 20 tablet 1   No current facility-administered medications for this visit.     Allergies Patient has no known allergies.   Physical Exam:  BP 106/64   Pulse 71   Wt 141 lb 9.6 oz (64.2 kg)   BMI 25.08 kg/m  Body mass index is 25.08 kg/m. General appearance: Well nourished,  well developed female in no acute distress.  Abdomen: soft, nttp, nd  Laboratory: none  Radiology:  CLINICAL DATA:  Left lower quadrant pain  EXAM: TRANSABDOMINAL AND TRANSVAGINAL ULTRASOUND OF PELVIS  DOPPLER ULTRASOUND OF OVARIES  TECHNIQUE: Both transabdominal and transvaginal ultrasound examinations of the pelvis were performed. Transabdominal technique was performed for global imaging of the pelvis including uterus, ovaries, adnexal regions, and pelvic cul-de-sac.  It was necessary to proceed with endovaginal exam following the transabdominal exam to visualize the uterus and ovaries. Color and duplex Doppler ultrasound was utilized to evaluate blood flow to the ovaries.  COMPARISON:  None.  FINDINGS: Uterus  Measurements: 7.4 x 4.9 x 5.0 cm. Intrauterine contraceptive device is not completely visualized, but appears to be in the lower uterine segment.  Endometrium  Thickness: 4 mm.  No focal abnormality visualized.  Right ovary  Measurements: 3.0 x 3.3 x 3.5 cm. Normal appearance/no adnexal mass.  Left ovary  Measurements: 2.2 x 2.2 x 2.7 cm. Normal appearance/no adnexal mass.  Pulsed Doppler evaluation of both ovaries demonstrates normal low-resistance arterial and venous waveforms.  Other findings  No abnormal free fluid.  IMPRESSION: 1. Intrauterine contraceptive device may be in the lower uterine segment. 2. No ovarian torsion or other acute abnormality.   Electronically Signed   By: Deatra RobinsonKevin  Herman M.D.   On: 07/02/2017 05:29  Assessment: pt stable  Plan:  1. Language barrier Interpreter used  2. Pelvic pain in female I told her that her s/s sound like that she is now on her period since [name] is out, which is common and that it is normal for it to be heavier and more painful now that she doesn't have a progestin effect with it. I recommended a PO toradol course to help with the discomfort, which she was amenable to  and d/w her re: different BC options. She is self pay and would like to do depo provera, as she states she isn't good with remembering OCPs. I told her to do condoms in the interim and call [facility] for their sliding scale fee and contraception clinic. All questions asked and answered.   RTC PRN  Cornelia Copaharlie Manjot Hinks, Jr MD Attending [facility](Faculty Practice)

## 2017-07-07 NOTE — Telephone Encounter (Signed)
Received a phone call from [facility] re: prescription for toradol - black box warning of risk of  hemorrhagic bleeding and recommend first dose im in office or accept risk .  Reviewed chart do not see received im in office or hospital . Discussed with Dr. Vergie LivingPickens and he requests they refill rx.

## 2017-07-07 NOTE — Progress Notes (Signed)
Video Interpreter # [id number]

## 2017-08-21 ENCOUNTER — Emergency Department (HOSPITAL_COMMUNITY): Payer: Self-pay

## 2017-08-21 ENCOUNTER — Encounter (HOSPITAL_COMMUNITY): Payer: Self-pay | Admitting: Emergency Medicine

## 2017-08-21 ENCOUNTER — Emergency Department (HOSPITAL_COMMUNITY)
Admission: EM | Admit: 2017-08-21 | Discharge: 2017-08-21 | Disposition: A | Discharge status: Home / Self Care | Payer: Self-pay | Attending: Emergency Medicine | Admitting: Emergency Medicine

## 2017-08-21 DIAGNOSIS — M5126 Other intervertebral disc displacement, lumbar region: Secondary | ICD-10-CM | POA: Insufficient documentation

## 2017-08-21 DIAGNOSIS — Z79899 Other long term (current) drug therapy: Secondary | ICD-10-CM | POA: Insufficient documentation

## 2017-08-21 MED ORDER — ONDANSETRON 4 MG PO TBDP: 4.0000 mg | ORAL_TABLET | Freq: Three times a day (TID) | ORAL | 0 refills | Status: DC | PRN

## 2017-08-21 MED ORDER — NAPROXEN 500 MG PO TABS: 500.0000 mg | ORAL_TABLET | Freq: Two times a day (BID) | ORAL | 0 refills | Status: DC | PRN

## 2017-08-21 MED ORDER — ONDANSETRON HCL 4 MG/2ML IJ SOLN: 4.0000 mg | Freq: Once | INTRAMUSCULAR | Status: DC

## 2017-08-21 MED ORDER — SULFAMETHOXAZOLE-TRIMETHOPRIM 800-160 MG PO TABS: 1.0000 | ORAL_TABLET | Freq: Two times a day (BID) | ORAL | 0 refills | Status: DC

## 2017-08-21 MED ORDER — HYDROMORPHONE HCL 2 MG/ML IJ SOLN
0.5000 mg | Freq: Once | INTRAMUSCULAR | Status: AC
  Administered 2017-08-21: 0.5 mg via INTRAVENOUS
  Filled 2017-08-21: qty 1

## 2017-08-21 MED ORDER — METHYLPREDNISOLONE 4 MG PO TBPK: ORAL_TABLET | ORAL | 0 refills | Status: DC

## 2017-08-21 MED ORDER — LORAZEPAM 2 MG/ML IJ SOLN
0.5000 mg | Freq: Once | INTRAMUSCULAR | Status: AC | PRN
  Administered 2017-08-21: 0.5 mg via INTRAVENOUS
  Filled 2017-08-21: qty 1

## 2017-08-21 NOTE — Discharge Instructions (Signed)
It was my pleasure taking care of you today!   Naproxen twice daily as needed for pain. Steroid dosepak as directed.   Please call the clinic listed first thing Tuesday morning to schedule a follow up appointment.   Return to the ED for worsening back pain, weakness or numbness of either leg, or if you develop either (1) an inability to urinate or have bowel movements, or (2) loss of your ability to control your bathroom functions (if you start having "accidents"), or if you develop other new symptoms that concern you.

## 2017-08-21 NOTE — ED Notes (Signed)
Patient transported to MRI 

## 2017-08-21 NOTE — ED Notes (Signed)
MRI called to advise they would be sending for the patient shortly.

## 2017-08-21 NOTE — ED Triage Notes (Signed)
Patient from home with [facility] with complaint of back pain and a hard sneeze. Patient self-administer PO tramadol. EMS gave fentanyl and  zofran PTA. 20g saline lock in left AC. Denies any recent trauma. Hysterectomy two month ago without complication.

## 2017-08-21 NOTE — ED Notes (Signed)
MRI called said pt is okay now, "its all out of her system"

## 2017-08-21 NOTE — ED Provider Notes (Signed)
[facility] EMERGENCY DEPARTMENT Provider Note   CSN: [id number] Arrival date & time: 08/21/17  1034     History   Chief Complaint Chief Complaint  Patient presents with  . Back Pain    HPI Kayla Stevenson is a 37 y.o. female.  The history is provided by the patient and medical records. A language interpreter was used (Spanish video interpreter).  Back Pain   Associated symptoms include numbness and weakness.   Kayla Stevenson is a 37 y.o. female who presents to the Emergency Department complaining of acute onset of severe low back pain.  Patient states that she has had intermittent central aching low back pain for about 2 months now.  Today, she sneezed and felt acute onset of severe pain much stronger than she has ever felt before.  She has been unable to walk since then, stating this is mostly due to the severity of her pain.  She does endorse numbness and tingling sensation that acutely began with this pain as well down both of her legs all the way down to her toes.  She has never had numbness or tingling to her extremities before.  She does report feeling weak to her legs as well. She felt extremely nauseous and did have one episode of emesis.  She was given Zofran prior to arrival which helped with her nausea and this is now resolved.  She was also given 150 mcg of fentanyl, however does not feel like this helped with the pain. Patient denies upper back or neck pain. No fever, saddle anesthesia, urinary complaints including retention/incontinence. No history of cancer, IVDU, or recent spinal procedures.    Past Medical History:  Diagnosis Date  . Anemia     Patient Active Problem List   Diagnosis Date Noted  . Language barrier 07/07/2017  . Vitamin D deficiency 02/27/2015  . Dizzy 02/26/2015  . Pelvic pain in female 02/26/2015    Past Surgical History:  Procedure Laterality Date  . CESAREAN SECTION    . CESAREAN SECTION       OB History    Gravida    4   Para  2   Term  2   Preterm      AB      Living  2     SAB      TAB      Ectopic      Multiple      Live Births  2            Home Medications    Prior to Admission medications   Medication Sig Start Date End Date Taking? Authorizing Provider  doxycycline (VIBRAMYCIN) 100 MG capsule Take 1 capsule (100 mg total) by mouth 2 (two) times daily. 07/02/17   Liberty Handy, PA-C  ketorolac (TORADOL) 10 MG tablet Take 1 tablet (10 mg total) by mouth every 6 (six) hours as needed. 07/07/17   Rhame Bing, MD  methylPREDNISolone (MEDROL DOSEPAK) 4 MG TBPK tablet Take as directed on package. 08/21/17   Johncarlos Holtsclaw, Chase Picket, PA-C  naproxen (NAPROSYN) 500 MG tablet Take 1 tablet (500 mg total) by mouth 2 (two) times daily as needed. 08/21/17   Elyas Villamor, Chase Picket, PA-C  ondansetron (ZOFRAN ODT) 4 MG disintegrating tablet Take 1 tablet (4 mg total) by mouth every 8 (eight) hours as needed for nausea or vomiting. 08/21/17   Nefertiti Mohamad, Chase Picket, PA-C  traMADol (ULTRAM) 50 MG tablet Take 1 tablet (50 mg total) by  mouth every 6 (six) hours as needed. 07/02/17   Liberty Handy, PA-C    Family History No family history on file.  Social History Social History   Tobacco Use  . Smoking status: Never Smoker  . Smokeless tobacco: Never Used  Substance Use Topics  . Alcohol use: No    Alcohol/week: 0.0 oz  . Drug use: No     Allergies   Patient has no known allergies.   Review of Systems Review of Systems  Genitourinary: Negative for difficulty urinating.  Musculoskeletal: Positive for back pain. Negative for neck pain.  Neurological: Positive for weakness and numbness.  All other systems reviewed and are negative.    Physical Exam Updated Vital Signs BP 96/69   Pulse 71   Temp 97.9 F (36.6 C) (Oral)   Resp 18   SpO2 100%   Physical Exam  Constitutional: She is oriented to person, place, and time. She appears well-developed and well-nourished. No  distress.  Appears very uncomfortable.   HENT:  Head: Normocephalic and atraumatic.  Cardiovascular: Normal rate, regular rhythm and normal heart sounds.  No murmur heard. Pulmonary/Chest: Effort normal and breath sounds normal. No respiratory distress.  Abdominal: Soft. She exhibits no distension.  No abdominal tenderness.  Musculoskeletal:  + midline lumbar tenderness. No tenderness to C/T spine. 5/5 muscle strength to bilateral LE's.   Neurological: She is alert and oriented to person, place, and time.  Sensation intact in all four extremities.  Skin: Skin is warm and dry.  Nursing note and vitals reviewed.    ED Treatments / Results  Labs (all labs ordered are listed, but only abnormal results are displayed) Labs Reviewed - No data to display  EKG None  Radiology Mr Lumbar Spine Wo Contrast  Result Date: 08/21/2017 CLINICAL DATA:  Onset of back pain after sneezing. EXAM: MRI LUMBAR SPINE WITHOUT CONTRAST TECHNIQUE: Multiplanar, multisequence MR imaging of the lumbar spine was performed. No intravenous contrast was administered. COMPARISON:  07/02/2017 FINDINGS: Segmentation: The lowest lumbar type non-rib-bearing vertebra is labeled as L5. Alignment:  Unremarkable Vertebrae:  No significant vertebral marrow edema is identified. Conus medullaris and cauda equina: Conus extends to the L1 level. Conus and cauda equina appear normal. Paraspinal and other soft tissues: Unremarkable Disc levels: L1-2: Unremarkable. L2-3: Unremarkable. L3-4: Unremarkable. L4-5: Unremarkable. L5-S1: No impingement. Left paracentral disc protrusion extends caudad and could be subacute. IMPRESSION: 1. Although no overt impingement is identified, there is a slightly left paracentral disc protrusion at L5-S1 extending caudad which could be subacute based on the indistinct margins. Electronically Signed   By: Gaylyn Rong M.D.   On: 08/21/2017 15:14    Procedures Procedures (including critical care  time)  Medications Ordered in ED Medications  ondansetron (ZOFRAN) injection 4 mg (has no administration in time range)  HYDROmorphone (DILAUDID) injection 0.5 mg (0.5 mg Intravenous Given 08/21/17 1245)  LORazepam (ATIVAN) injection 0.5 mg (0.5 mg Intravenous Given 08/21/17 1246)     Initial Impression / Assessment and Plan / ED Course  I have reviewed the triage vital signs and the nursing notes.  Pertinent labs & imaging results that were available during my care of the patient were reviewed by me and considered in my medical decision making (see chart for details).    Kayla Stevenson is a 37 y.o. female who presents to ED for low back pain for about 2 months which acutely worsened today when she sneezed.  This was associated with bilateral numbness and tingling to  her lower extremities which has been persistent.  No bowel or bladder incontinence/retention.  No sensory deficits noted on exam, however she does have 4/5 muscle strength, possibly secondary to pain, however given muscle weakness and complaints of numbness/tingling down lower extremities, MRI was obtained.  MRI does show left paracentral disc protrusion at L5-S1.  Will treat symptomatically and have her follow-up with neurosurgery.  Reasons to return to the emergency department, home care instructions and follow-up care instructions were discussed with the patient using video Spanish interpreter.  All questions were answered.  Patient discussed with Dr. Silverio Lay who agrees with treatment plan.    Final Clinical Impressions(s) / ED Diagnoses   Final diagnoses:  Protrusion of lumbar intervertebral disc    ED Discharge Orders        Ordered    methylPREDNISolone (MEDROL DOSEPAK) 4 MG TBPK tablet     08/21/17 1546    naproxen (NAPROSYN) 500 MG tablet  2 times daily PRN     08/21/17 1546    ondansetron (ZOFRAN ODT) 4 MG disintegrating tablet  Every 8 hours PRN     08/21/17 1555    sulfamethoxazole-trimethoprim (BACTRIM DS,SEPTRA  DS) 800-160 MG tablet  2 times daily,   Status:  Discontinued     08/21/17 1558       Kayla Stevenson, Chase Picket, PA-C 08/21/17 1606    Charlynne Pander, MD 08/22/17 [phone]

## 2017-08-21 NOTE — ED Notes (Signed)
MRI called, pt vomiting in MRI

## 2017-08-21 NOTE — ED Notes (Signed)
Pt stable, ambulatory, states understanding of discharge instructions 

## 2017-09-13 ENCOUNTER — Ambulatory Visit: Payer: Self-pay | Admitting: Internal Medicine

## 2017-09-14 ENCOUNTER — Encounter: Payer: Self-pay | Admitting: Internal Medicine

## 2017-09-14 ENCOUNTER — Ambulatory Visit: Payer: Self-pay | Attending: Internal Medicine | Admitting: Internal Medicine

## 2017-09-14 VITALS — BP 104/69 | HR 90 | Temp 98.0°F | Resp 16 | Ht 62.5 in | Wt 146.4 lb

## 2017-09-14 DIAGNOSIS — IMO0001 Reserved for inherently not codable concepts without codable children: Secondary | ICD-10-CM

## 2017-09-14 DIAGNOSIS — M545 Low back pain, unspecified: Secondary | ICD-10-CM

## 2017-09-14 DIAGNOSIS — Z30013 Encounter for initial prescription of injectable contraceptive: Secondary | ICD-10-CM | POA: Insufficient documentation

## 2017-09-14 DIAGNOSIS — Z789 Other specified health status: Secondary | ICD-10-CM

## 2017-09-14 LAB — POCT URINE PREGNANCY: Preg Test, Ur: NEGATIVE

## 2017-09-14 MED ORDER — TRAMADOL HCL 50 MG PO TABS: 50.0000 mg | ORAL_TABLET | Freq: Three times a day (TID) | ORAL | 0 refills | Status: DC | PRN

## 2017-09-14 MED ORDER — MEDROXYPROGESTERONE ACETATE 150 MG/ML IM SUSP
150.0000 mg | Freq: Once | INTRAMUSCULAR | Status: AC
  Administered 2017-09-14: 150 mg via INTRAMUSCULAR

## 2017-09-14 MED ORDER — CYCLOBENZAPRINE HCL 5 MG PO TABS: 5.0000 mg | ORAL_TABLET | Freq: Two times a day (BID) | ORAL | 0 refills | Status: DC | PRN

## 2017-09-14 NOTE — Progress Notes (Signed)
Patient ID: Kayla Stevenson, female    DOB: 12/28/1980  MRN: [id number]  CC: re-establish and Contraception   Subjective: Kayla Stevenson is a 37 y.o. female who presents for ER f/u and contraception Her concerns today include:   Pt seen in ER 08/21/2017 for acute onset LBP.  She was having dull aching in back for 2 mths which she thought was due to mirena.  On day of ER visit, she sneezed and felt more intense pain. Had to go to ER via EMS. -pain from lower thoracic area to lower lumbar but worse in lumbar region.  Given Medrol pk, Tramadol, Naprosyn which she has completed.  MRI revealed: IMPRESSION: 1. Although no overt impingement is identified, there is a slightly left paracentral disc protrusion at L5-S1 extending caudad which could be subacute based on the indistinct margins.  Now taking a pain med BID which she has from [place].  Initially pain 10/10; now at 5-6/10.  Worse at end of work day.  She clean homes for a living. Pain is little better but "I cannot bend over, turn quickly, mop or sit for long periods."  Can now stand and walk for certain periods.  Initially numbness in legs but this has resolved. Felt it again a few days ago when she was sitting in car for long time No loss of bowel or bladder, no saddle anesthesia. -Took 1.5-2 wks off initially. She does a lot of lifting and bending at work.   She is also wanting to be placed on a Depo. shot for birth control.  She recently had the Mirena removed as it had expired.  Reports being on Depo-Provera in the past and tolerating it well.  Does not want the Mirena again.  Denies any history of blood clots.  Patient Active Problem List   Diagnosis Date Noted  . Language barrier 07/07/2017  . Vitamin D deficiency 02/27/2015  . Dizzy 02/26/2015  . Pelvic pain in female 02/26/2015     Current Outpatient Medications on File Prior to Visit  Medication Sig Dispense Refill  . naproxen (NAPROSYN) 500 MG tablet Take 1 tablet (500  mg total) by mouth 2 (two) times daily as needed. (Patient not taking: Reported on 09/14/2017) 30 tablet 0  . ondansetron (ZOFRAN ODT) 4 MG disintegrating tablet Take 1 tablet (4 mg total) by mouth every 8 (eight) hours as needed for nausea or vomiting. (Patient not taking: Reported on 09/14/2017) 10 tablet 0  . traMADol (ULTRAM) 50 MG tablet Take 1 tablet (50 mg total) by mouth every 6 (six) hours as needed. (Patient not taking: Reported on 09/14/2017) 15 tablet 0   No current facility-administered medications on file prior to visit.     No Known Allergies  Social History   Socioeconomic History  . Marital status: Single    Spouse name: Not on file  . Number of children: Not on file  . Years of education: Not on file  . Highest education level: Not on file  Occupational History  . Not on file  Social Needs  . Financial resource strain: Not on file  . Food insecurity:    Worry: Not on file    Inability: Not on file  . Transportation needs:    Medical: Not on file    Non-medical: Not on file  Tobacco Use  . Smoking status: Never Smoker  . Smokeless tobacco: Never Used  Substance and Sexual Activity  . Alcohol use: No    Alcohol/week: 0.0 oz  .  Drug use: No  . Sexual activity: Yes    Birth control/protection: IUD  Lifestyle  . Physical activity:    Days per week: Not on file    Minutes per session: Not on file  . Stress: Not on file  Relationships  . Social connections:    Talks on phone: Not on file    Gets together: Not on file    Attends religious service: Not on file    Active member of club or organization: Not on file    Attends meetings of clubs or organizations: Not on file    Relationship status: Not on file  . Intimate partner violence:    Fear of current or ex partner: Not on file    Emotionally abused: Not on file    Physically abused: Not on file    Forced sexual activity: Not on file  Other Topics Concern  . Not on file  Social History Narrative  .  Not on file    No family history on file.  Past Surgical History:  Procedure Laterality Date  . CESAREAN SECTION    . CESAREAN SECTION      ROS: Review of Systems Negative except as above. PHYSICAL EXAM: BP 104/69   Pulse 90   Temp 98 F (36.7 C) (Oral)   Resp 16   Ht 5' 2.5" (1.588 m)   Wt 146 lb 6.4 oz (66.4 kg)   LMP 09/06/2017   SpO2 100%   BMI 26.35 kg/m   Physical Exam  General appearance - alert, well appearing, young female.  She appears uncomfortable when transferring from chair to table. Mental status - normal mood, behavior, speech, dress, motor activity, and thought processes Neurological -mild and diffuse tenderness on palpation of the lumbar spine and surrounding paraspinal muscles.  Straight leg raise reproduces pain on both sides.  Power in the lower extremities 5/5 bilaterally.  Gross sensation intact.  Results for orders placed or performed in visit on 09/14/17  POCT urine pregnancy  Result Value Ref Range   Preg Test, Ur Negative Negative     ASSESSMENT AND PLAN: 1. Acute midline low back pain without sciatica I recommend that patient avoid lifting, bending, pushing and pulling for the next 2 to 3 weeks.  Work excuse given keeping her out until 15 July. Advised use of a heating pad.  Limited supply of Flexeril and tramadol given.  Patient warned that medicines can cause drowsiness.  Advised to discontinue the medicine from [place]. - cyclobenzaprine (FLEXERIL) 5 MG tablet; Take 1 tablet (5 mg total) by mouth 2 (two) times daily as needed for muscle spasms.  Dispense: 30 tablet; Refill: 0 - traMADol (ULTRAM) 50 MG tablet; Take 1 tablet (50 mg total) by mouth every 8 (eight) hours as needed.  Dispense: 40 tablet; Refill: 0 - Ambulatory referral to Physical Therapy  2. Birth control Went over possible side effects of Depo including weight gain, unpredictable bleeding especially during the first several months. - POCT urine pregnancy -  medroxyPROGESTERone (DEPO-PROVERA) injection 150 mg   Patient was given the opportunity to ask questions.  Patient verbalized understanding of the plan and was able to repeat key elements of the plan.  [employer] interpreter used during this encounter.  Orders Placed This Encounter  Procedures  . Ambulatory referral to Physical Therapy  . POCT urine pregnancy     Requested Prescriptions   Signed Prescriptions Disp Refills  . cyclobenzaprine (FLEXERIL) 5 MG tablet 30 tablet 0  Sig: Take 1 tablet (5 mg total) by mouth 2 (two) times daily as needed for muscle spasms.  . traMADol (ULTRAM) 50 MG tablet 40 tablet 0    Sig: Take 1 tablet (50 mg total) by mouth every 8 (eight) hours as needed.    Return if symptoms worsen or fail to improve.  Jonah Blue, MD, FACP

## 2017-09-14 NOTE — Patient Instructions (Signed)
Please schedule a nurse visit between September 3- September 17 for next depo shot   Medroxyprogesterone injection [Contraceptive] What is this medicine? MEDROXYPROGESTERONE (me DROX ee proe JES te rone) contraceptive injections prevent pregnancy. They provide effective birth control for 3 months. Depo-subQ Provera 104 is also used for treating pain related to endometriosis. This medicine may be used for other purposes; ask your health care provider or pharmacist if you have questions. COMMON BRAND NAME(S): Depo-Provera, Depo-subQ Provera 104 What should I tell my health care provider before I take this medicine? They need to know if you have any of these conditions: -frequently drink alcohol -asthma -blood vessel disease or a history of a blood clot in the lungs or legs -bone disease such as osteoporosis -breast cancer -diabetes -eating disorder (anorexia nervosa or bulimia) -high blood pressure -HIV infection or AIDS -kidney disease -liver disease -mental depression -migraine -seizures (convulsions) -stroke -tobacco smoker -vaginal bleeding -an unusual or allergic reaction to medroxyprogesterone, other hormones, medicines, foods, dyes, or preservatives -pregnant or trying to get pregnant -breast-feeding How should I use this medicine? Depo-Provera Contraceptive injection is given into a muscle. Depo-subQ Provera 104 injection is given under the skin. These injections are given by a health care professional. You must not be pregnant before getting an injection. The injection is usually given during the first 5 days after the start of a menstrual period or 6 weeks after delivery of a baby. Talk to your pediatrician regarding the use of this medicine in children. Special care may be needed. These injections have been used in female children who have started having menstrual periods. Overdosage: If you think you have taken too much of this medicine contact a poison control center or  emergency room at once. NOTE: This medicine is only for you. Do not share this medicine with others. What if I miss a dose? Try not to miss a dose. You must get an injection once every 3 months to maintain birth control. If you cannot keep an appointment, call and reschedule it. If you wait longer than 13 weeks between Depo-Provera contraceptive injections or longer than 14 weeks between Depo-subQ Provera 104 injections, you could get pregnant. Use another method for birth control if you miss your appointment. You may also need a pregnancy test before receiving another injection. What may interact with this medicine? Do not take this medicine with any of the following medications: -bosentan This medicine may also interact with the following medications: -aminoglutethimide -antibiotics or medicines for infections, especially rifampin, rifabutin, rifapentine, and griseofulvin -aprepitant -barbiturate medicines such as phenobarbital or primidone -bexarotene -carbamazepine -medicines for seizures like ethotoin, felbamate, oxcarbazepine, phenytoin, topiramate -modafinil -St. John's wort This list may not describe all possible interactions. Give your health care provider a list of all the medicines, herbs, non-prescription drugs, or dietary supplements you use. Also tell them if you smoke, drink alcohol, or use illegal drugs. Some items may interact with your medicine. What should I watch for while using this medicine? This drug does not protect you against HIV infection (AIDS) or other sexually transmitted diseases. Use of this product may cause you to lose calcium from your bones. Loss of calcium may cause weak bones (osteoporosis). Only use this product for more than 2 years if other forms of birth control are not right for you. The longer you use this product for birth control the more likely you will be at risk for weak bones. Ask your health care professional how you can keep strong bones.  You  may have a change in bleeding pattern or irregular periods. Many females stop having periods while taking this drug. If you have received your injections on time, your chance of being pregnant is very low. If you think you may be pregnant, see your health care professional as soon as possible. Tell your health care professional if you want to get pregnant within the next year. The effect of this medicine may last a long time after you get your last injection. What side effects may I notice from receiving this medicine? Side effects that you should report to your doctor or health care professional as soon as possible: -allergic reactions like skin rash, itching or hives, swelling of the face, lips, or tongue -breast tenderness or discharge -breathing problems -changes in vision -depression -feeling faint or lightheaded, falls -fever -pain in the abdomen, chest, groin, or leg -problems with balance, talking, walking -unusually weak or tired -yellowing of the eyes or skin Side effects that usually do not require medical attention (report to your doctor or health care professional if they continue or are bothersome): -acne -fluid retention and swelling -headache -irregular periods, spotting, or absent periods -temporary pain, itching, or skin reaction at site where injected -weight gain This list may not describe all possible side effects. Call your doctor for medical advice about side effects. You may report side effects to FDA at 1-800-FDA-1088. Where should I keep my medicine? This does not apply. The injection will be given to you by a health care professional. NOTE: This sheet is a summary. It may not cover all possible information. If you have questions about this medicine, talk to your doctor, pharmacist, or health care provider.  2018 Elsevier/Gold Standard (2008-04-06 18:37:56)

## 2017-09-16 ENCOUNTER — Ambulatory Visit: Payer: Self-pay | Admitting: Physical Therapy

## 2017-10-14 ENCOUNTER — Telehealth: Payer: Self-pay | Admitting: Internal Medicine

## 2017-10-14 NOTE — Telephone Encounter (Signed)
Pt came in to request a referral be switched from  -[facility] to another place, she did not know the name of the facility nor the phone number. Please follow up  [phone]

## 2017-10-18 NOTE — Telephone Encounter (Signed)
Noted! Thank you

## 2017-10-20 ENCOUNTER — Ambulatory Visit: Payer: Self-pay | Attending: Internal Medicine | Admitting: Physician Assistant

## 2017-10-20 VITALS — BP 94/61 | HR 73 | Temp 98.5°F | Resp 18 | Ht 64.0 in | Wt 143.0 lb

## 2017-10-20 DIAGNOSIS — Z789 Other specified health status: Secondary | ICD-10-CM

## 2017-10-20 DIAGNOSIS — N921 Excessive and frequent menstruation with irregular cycle: Secondary | ICD-10-CM

## 2017-10-20 DIAGNOSIS — Z79899 Other long term (current) drug therapy: Secondary | ICD-10-CM | POA: Insufficient documentation

## 2017-10-20 DIAGNOSIS — N926 Irregular menstruation, unspecified: Secondary | ICD-10-CM

## 2017-10-20 DIAGNOSIS — Z793 Long term (current) use of hormonal contraceptives: Secondary | ICD-10-CM | POA: Insufficient documentation

## 2017-10-20 DIAGNOSIS — N938 Other specified abnormal uterine and vaginal bleeding: Secondary | ICD-10-CM | POA: Insufficient documentation

## 2017-10-20 LAB — POCT URINALYSIS DIPSTICK
Bilirubin, UA: NEGATIVE
Glucose, UA: NEGATIVE
LEUKOCYTES UA: NEGATIVE
Nitrite, UA: NEGATIVE
Protein, UA: POSITIVE — AB
Spec Grav, UA: 1.015 (ref 1.010–1.025)
Urobilinogen, UA: 0.2 E.U./dL
pH, UA: 7 (ref 5.0–8.0)

## 2017-10-20 LAB — POCT URINE PREGNANCY: Preg Test, Ur: NEGATIVE

## 2017-10-20 NOTE — Progress Notes (Signed)
Patient ID: Kayla Stevenson, female   DOB: Apr 27, 1980, 37 y.o.   MRN: [id number]       Kayla Stevenson, is a 37 y.o. female  NFA:[id number]  ION:[id number]  DOB - Apr 27, 1980  Subjective:  Chief Complaint and HPI: Kayla Stevenson is a 37 y.o. female here today Having bleeding after getting Depo Provera about 1 month ago.  She had been on Depo provera in the past about 7-8 years ago and tolerated it fine.  Using about 4 pads daily and passing some blood clots.  Some period/pelvic cramping.  No STD risk factors.  Feels like she is feeling a little weak [employer] interpre[employer] translating  "Eduardo"  ROS:   Constitutional:  No f/c, No night sweats, No unexplained weight loss. EENT:  No vision changes, No blurry vision, No hearing changes. No mouth, throat, or ear problems.  Respiratory: No cough, No SOB Cardiac: No CP, no palpitations GI:  No abd pain, No N/V/D. GU: No Urinary s/sx Musculoskeletal: No joint pain Neuro: No headache, no dizziness, no motor weakness.  Skin: No rash Endocrine:  No polydipsia. No polyuria.  Psych: Denies SI/HI  No problems updated.  ALLERGIES: No Known Allergies  PAST MEDICAL HISTORY: Past Medical History:  Diagnosis Date  . Anemia     MEDICATIONS AT HOME: Prior to Admission medications   Medication Sig Start Date End Date Taking? Authorizing Provider  cyclobenzaprine (FLEXERIL) 5 MG tablet Take 1 tablet (5 mg total) by mouth 2 (two) times daily as needed for muscle spasms. 09/14/17   Marcine MatarJohnson, Deborah B, MD  naproxen (NAPROSYN) 500 MG tablet Take 1 tablet (500 mg total) by mouth 2 (two) times daily as needed. Patient not taking: Reported on 09/14/2017 08/21/17   Ward, Chase PicketJaime Pilcher, PA-C  ondansetron (ZOFRAN ODT) 4 MG disintegrating tablet Take 1 tablet (4 mg total) by mouth every 8 (eight) hours as needed for nausea or vomiting. Patient not taking: Reported on 09/14/2017 08/21/17   Ward, Chase PicketJaime Pilcher, PA-C  traMADol (ULTRAM) 50 MG tablet Take 1 tablet  (50 mg total) by mouth every 6 (six) hours as needed. Patient not taking: Reported on 09/14/2017 07/02/17   Liberty HandyGibbons, Claudia J, PA-C  traMADol (ULTRAM) 50 MG tablet Take 1 tablet (50 mg total) by mouth every 8 (eight) hours as needed. 09/14/17   Marcine MatarJohnson, Deborah B, MD     Objective:  EXAM:   Vitals:   10/20/17 1443  BP: 94/61  Pulse: 73  Resp: 18  Temp: 98.5 F (36.9 C)  TempSrc: Oral  SpO2: 99%  Weight: 143 lb (64.9 kg)  Height: 5\' 4"  (1.626 m)    General appearance : A&OX3. NAD. Non-toxic-appearing HEENT: Atraumatic and Normocephalic.  PERRLA. EOM intact. Neck: supple, no JVD. No cervical lymphadenopathy. No thyromegaly Chest/Lungs:  Breathing-non-labored, Good air entry bilaterally, breath sounds normal without rales, rhonchi, or wheezing  CVS: S1 S2 regular, no murmurs, gallops, rubs  Abdomen: Bowel sounds present, Non tender and not distended with no gaurding, rigidity or rebound. Extremities: Bilateral Lower Ext shows no edema, both legs are warm to touch with = pulse throughout Neurology:  CN II-XII grossly intact, Non focal.   Psych:  TP linear. J/I WNL. Normal speech. Appropriate eye contact and affect.  Skin:  No Rash  Data Review Lab Results  Component Value Date   HGBA1C 5.40 02/26/2015     Assessment & Plan   1. Irregular bleeding Likely due to depo provera - POCT urine pregnancy - CBC with Differential/Platelet - Urine  cytology ancillary onlyUA shows ketones.  Increase water intake to 80-100 ounces daily.  Continue with next Depo on schedule 12/06/2017.   2. Breakthrough bleeding on depo provera - CBC with Differential/Platelet  3. Language barrier [employer] interpreters used and additional time performing visit was required.      Patient have been counseled extensively about nutrition and exercise  Return for keep september appt 2019h.  The patient was given clear instructions to go to ER or return to medical center if symptoms don't improve,  worsen or new problems develop. The patient verbalized understanding. The patient was told to call to get lab results if they haven't heard anything in the next week.     Georgian Co, PA-C [facility] [place], [place] [phone]   10/20/2017, 2:50 PM

## 2017-10-20 NOTE — Patient Instructions (Addendum)
Drink 80-100 ounces of water daily 

## 2017-10-21 LAB — CBC WITH DIFFERENTIAL/PLATELET
Basophils Absolute: 0 10*3/uL (ref 0.0–0.2)
Basos: 0 %
EOS (ABSOLUTE): 0.1 10*3/uL (ref 0.0–0.4)
EOS: 1 %
HEMOGLOBIN: 14.1 g/dL (ref 11.1–15.9)
Hematocrit: 40.4 % (ref 34.0–46.6)
Immature Grans (Abs): 0 10*3/uL (ref 0.0–0.1)
Immature Granulocytes: 0 %
LYMPHS ABS: 2.2 10*3/uL (ref 0.7–3.1)
Lymphs: 26 %
MCH: 30.7 pg (ref 26.6–33.0)
MCHC: 34.9 g/dL (ref 31.5–35.7)
MCV: 88 fL (ref 79–97)
MONOCYTES: 7 %
MONOS ABS: 0.6 10*3/uL (ref 0.1–0.9)
Neutrophils Absolute: 5.6 10*3/uL (ref 1.4–7.0)
Neutrophils: 66 %
Platelets: 273 10*3/uL (ref 150–450)
RBC: 4.59 x10E6/uL (ref 3.77–5.28)
RDW: 13.8 % (ref 12.3–15.4)
WBC: 8.5 10*3/uL (ref 3.4–10.8)

## 2017-10-21 LAB — URINE CYTOLOGY ANCILLARY ONLY
Chlamydia: NEGATIVE
NEISSERIA GONORRHEA: NEGATIVE
TRICH (WINDOWPATH): NEGATIVE

## 2017-10-23 LAB — URINE CYTOLOGY ANCILLARY ONLY
Bacterial vaginitis: NEGATIVE
Candida vaginitis: NEGATIVE

## 2017-10-26 ENCOUNTER — Telehealth: Payer: Self-pay | Admitting: *Deleted

## 2017-10-26 NOTE — Telephone Encounter (Signed)
-----   Message from Anders SimmondsAngela M McClung, [place] JerseyPA-C sent at 10/26/2017  7:54 AM EDT ----- Please call patient.  Urine was negative for STD, BV, or yeast.  Blood count was normal.  Follow-up as planned.  Thanks, Georgian CoAngela McClung, PA-C

## 2017-10-26 NOTE — Telephone Encounter (Signed)
Medical Assistant used [employer] to contact patient.  Interpreter Name: Ephriam KnucklesChristian Interpreter #: [phone] Patient was not available, [employer] left patient a voicemail. !!Please inform patient of urine being negative for any STD. Blood count is normal and follow up as planned!!!

## 2017-12-06 ENCOUNTER — Ambulatory Visit: Payer: Self-pay

## 2018-05-03 ENCOUNTER — Emergency Department (HOSPITAL_COMMUNITY)
Admission: EM | Admit: 2018-05-03 | Discharge: 2018-05-03 | Disposition: A | Discharge status: Home / Self Care | Payer: Self-pay | Attending: Emergency Medicine | Admitting: Emergency Medicine

## 2018-05-03 ENCOUNTER — Ambulatory Visit (HOSPITAL_COMMUNITY)
Admission: EM | Admit: 2018-05-03 | Discharge: 2018-05-03 | Disposition: A | Discharge status: Home / Self Care | Payer: Self-pay

## 2018-05-03 ENCOUNTER — Emergency Department (HOSPITAL_COMMUNITY): Payer: Self-pay

## 2018-05-03 ENCOUNTER — Encounter (HOSPITAL_COMMUNITY): Payer: Self-pay

## 2018-05-03 ENCOUNTER — Other Ambulatory Visit: Payer: Self-pay

## 2018-05-03 DIAGNOSIS — R1030 Lower abdominal pain, unspecified: Secondary | ICD-10-CM | POA: Insufficient documentation

## 2018-05-03 DIAGNOSIS — Z79899 Other long term (current) drug therapy: Secondary | ICD-10-CM | POA: Insufficient documentation

## 2018-05-03 DIAGNOSIS — R109 Unspecified abdominal pain: Secondary | ICD-10-CM

## 2018-05-03 LAB — COMPREHENSIVE METABOLIC PANEL
ALT: 23 U/L (ref 0–44)
AST: 24 U/L (ref 15–41)
Albumin: 3.7 g/dL (ref 3.5–5.0)
Alkaline Phosphatase: 82 U/L (ref 38–126)
Anion gap: 9 (ref 5–15)
BUN: 14 mg/dL (ref 6–20)
CO2: 22 mmol/L (ref 22–32)
Calcium: 9.2 mg/dL (ref 8.9–10.3)
Chloride: 105 mmol/L (ref 98–111)
Creatinine, Ser: 0.61 mg/dL (ref 0.44–1.00)
GFR calc Af Amer: >60 mL/min (ref >60)
GFR calc non Af Amer: >60 mL/min (ref >60)
Glucose, Bld: 103 mg/dL — ABNORMAL HIGH (ref 70–99)
Potassium: 4.3 mmol/L (ref 3.5–5.1)
Sodium: 136 mmol/L (ref 135–145)
Total Bilirubin: 1 mg/dL (ref 0.3–1.2)
Total Protein: 6.4 g/dL — ABNORMAL LOW (ref 6.5–8.1)

## 2018-05-03 LAB — URINALYSIS, ROUTINE W REFLEX MICROSCOPIC
Bilirubin Urine: NEGATIVE
Glucose, UA: NEGATIVE mg/dL
Ketones, ur: NEGATIVE mg/dL
Leukocytes, UA: NEGATIVE
Nitrite: NEGATIVE
Protein, ur: NEGATIVE mg/dL
Specific Gravity, Urine: 1.011 (ref 1.005–1.030)
pH: 5 (ref 5.0–8.0)

## 2018-05-03 LAB — CBC WITH DIFFERENTIAL/PLATELET
Abs Immature Granulocytes: 0.04 10*3/uL (ref 0.00–0.07)
Basophils Absolute: 0 10*3/uL (ref 0.0–0.1)
Basophils Relative: 0 %
Eosinophils Absolute: 0.1 10*3/uL (ref 0.0–0.5)
Eosinophils Relative: 1 %
HCT: 41.9 % (ref 36.0–46.0)
Hemoglobin: 14.7 g/dL (ref 12.0–15.0)
Immature Granulocytes: 0 %
Lymphocytes Relative: 12 %
Lymphs Abs: 1.4 10*3/uL (ref 0.7–4.0)
MCH: 30.5 pg (ref 26.0–34.0)
MCHC: 35.1 g/dL (ref 30.0–36.0)
MCV: 86.9 fL (ref 80.0–100.0)
Monocytes Absolute: 0.7 10*3/uL (ref 0.1–1.0)
Monocytes Relative: 5 %
Neutro Abs: 9.9 10*3/uL — ABNORMAL HIGH (ref 1.7–7.7)
Neutrophils Relative %: 82 %
Platelets: 259 10*3/uL (ref 150–400)
RBC: 4.82 MIL/uL (ref 3.87–5.11)
RDW: 12.8 % (ref 11.5–15.5)
WBC: 12.2 10*3/uL — ABNORMAL HIGH (ref 4.0–10.5)
nRBC: 0 % (ref 0.0–0.2)

## 2018-05-03 LAB — WET PREP, GENITAL
Clue Cells Wet Prep HPF POC: NONE SEEN
Sperm: NONE SEEN
Trich, Wet Prep: NONE SEEN
Yeast Wet Prep HPF POC: NONE SEEN

## 2018-05-03 LAB — I-STAT BETA HCG BLOOD, ED (MC, WL, AP ONLY): I-stat hCG, quantitative: <5 m[IU]/mL (ref <5)

## 2018-05-03 LAB — LIPASE, BLOOD: Lipase: 22 U/L (ref 11–51)

## 2018-05-03 MED ORDER — DOXYCYCLINE HYCLATE 100 MG PO CAPS: 100.0000 mg | ORAL_CAPSULE | Freq: Two times a day (BID) | ORAL | 0 refills | Status: DC

## 2018-05-03 MED ORDER — IOHEXOL 300 MG/ML  SOLN
100.0000 mL | Freq: Once | INTRAMUSCULAR | Status: AC | PRN
  Administered 2018-05-03: 100 mL via INTRAVENOUS

## 2018-05-03 MED ORDER — NAPROXEN 500 MG PO TABS: 500.0000 mg | ORAL_TABLET | Freq: Two times a day (BID) | ORAL | 0 refills | Status: DC

## 2018-05-03 MED ORDER — LIDOCAINE HCL (PF) 1 % IJ SOLN
INTRAMUSCULAR | Status: AC
  Administered 2018-05-03: 0.9 mL
  Filled 2018-05-03: qty 5

## 2018-05-03 MED ORDER — MORPHINE SULFATE (PF) 4 MG/ML IV SOLN
4.0000 mg | Freq: Once | INTRAVENOUS | Status: AC
  Administered 2018-05-03: 4 mg via INTRAVENOUS
  Filled 2018-05-03: qty 1

## 2018-05-03 MED ORDER — KETOROLAC TROMETHAMINE 15 MG/ML IJ SOLN
15.0000 mg | Freq: Once | INTRAMUSCULAR | Status: AC
  Administered 2018-05-03: 15 mg via INTRAVENOUS
  Filled 2018-05-03: qty 1

## 2018-05-03 MED ORDER — CEFTRIAXONE SODIUM 250 MG IJ SOLR
250.0000 mg | Freq: Once | INTRAMUSCULAR | Status: AC
  Administered 2018-05-03: 250 mg via INTRAMUSCULAR
  Filled 2018-05-03: qty 250

## 2018-05-03 NOTE — ED Triage Notes (Signed)
Pt here c/o lower abd pain radiating to the rectum that began around 1030 am today along with nausea; pt denies any fevers

## 2018-05-03 NOTE — ED Provider Notes (Signed)
16:00: Assumed care of patient from Burna Forts PA-C at change of shift pending CT results.   Please see prior provider note for full H&P.   The patient is a 38 year old female with a history of prior C-sections as well as prior PID who presents to the emergency department for intermittent pelvic pain for the past few days.  No noted asssociated symptoms.  Physical Exam  BP 106/72 (BP Location: Right Arm)   Pulse 92   Temp 97.9 F (36.6 C) (Oral)   Resp 16   Ht 5\' 2"  (1.575 m)   Wt 69.9 kg   LMP 04/21/2017   SpO2 100%   BMI 28.17 kg/m   Physical Exam Vitals signs and nursing note reviewed.  Constitutional:      General: She is not in acute distress.    Appearance: She is well-developed.  HENT:     Head: Normocephalic and atraumatic.  Eyes:     General:        Right eye: No discharge.        Left eye: No discharge.     Conjunctiva/sclera: Conjunctivae normal.  Abdominal:     Tenderness: There is abdominal tenderness in the suprapubic area.  Neurological:     Mental Status: She is alert.     Comments: Clear speech.   Psychiatric:        Behavior: Behavior normal.        Thought Content: Thought content normal.     ED Course/Procedures     Results for orders placed or performed during the hospital encounter of 05/03/18  Wet prep, genital  Result Value Ref Range   Yeast Wet Prep HPF POC NONE SEEN NONE SEEN   Trich, Wet Prep NONE SEEN NONE SEEN   Clue Cells Wet Prep HPF POC NONE SEEN NONE SEEN   WBC, Wet Prep HPF POC RARE (A) NONE SEEN   Sperm NONE SEEN   CBC with Differential  Result Value Ref Range   WBC 12.2 (H) 4.0 - 10.5 K/uL   RBC 4.82 3.87 - 5.11 MIL/uL   Hemoglobin 14.7 12.0 - 15.0 g/dL   HCT 95.6 21.3 - 08.6 %   MCV 86.9 80.0 - 100.0 fL   MCH 30.5 26.0 - 34.0 pg   MCHC 35.1 30.0 - 36.0 g/dL   RDW 57.8 46.9 - 62.9 %   Platelets 259 150 - 400 K/uL   nRBC 0.0 0.0 - 0.2 %   Neutrophils Relative % 82 %   Neutro Abs 9.9 (H) 1.7 - 7.7 K/uL   Lymphocytes  Relative 12 %   Lymphs Abs 1.4 0.7 - 4.0 K/uL   Monocytes Relative 5 %   Monocytes Absolute 0.7 0.1 - 1.0 K/uL   Eosinophils Relative 1 %   Eosinophils Absolute 0.1 0.0 - 0.5 K/uL   Basophils Relative 0 %   Basophils Absolute 0.0 0.0 - 0.1 K/uL   Immature Granulocytes 0 %   Abs Immature Granulocytes 0.04 0.00 - 0.07 K/uL  Comprehensive metabolic panel  Result Value Ref Range   Sodium 136 135 - 145 mmol/L   Potassium 4.3 3.5 - 5.1 mmol/L   Chloride 105 98 - 111 mmol/L   CO2 22 22 - 32 mmol/L   Glucose, Bld 103 (H) 70 - 99 mg/dL   BUN 14 6 - 20 mg/dL   Creatinine, Ser 5.28 0.44 - 1.00 mg/dL   Calcium 9.2 8.9 - 41.3 mg/dL   Total Protein 6.4 (L) 6.5 - 8.1  g/dL   Albumin 3.7 3.5 - 5.0 g/dL   AST 24 15 - 41 U/L   ALT 23 0 - 44 U/L   Alkaline Phosphatase 82 38 - 126 U/L   Total Bilirubin 1.0 0.3 - 1.2 mg/dL   GFR calc non Af Amer >60 >60 mL/min   GFR calc Af Amer >60 >60 mL/min   Anion gap 9 5 - 15  Lipase, blood  Result Value Ref Range   Lipase 22 11 - 51 U/L  Urinalysis, Routine w reflex microscopic  Result Value Ref Range   Color, Urine YELLOW YELLOW   APPearance CLEAR CLEAR   Specific Gravity, Urine 1.011 1.005 - 1.030   pH 5.0 5.0 - 8.0   Glucose, UA NEGATIVE NEGATIVE mg/dL   Hgb urine dipstick SMALL (A) NEGATIVE   Bilirubin Urine NEGATIVE NEGATIVE   Ketones, ur NEGATIVE NEGATIVE mg/dL   Protein, ur NEGATIVE NEGATIVE mg/dL   Nitrite NEGATIVE NEGATIVE   Leukocytes, UA NEGATIVE NEGATIVE   RBC / HPF 0-5 0 - 5 RBC/hpf   WBC, UA 0-5 0 - 5 WBC/hpf   Bacteria, UA RARE (A) NONE SEEN   Squamous Epithelial / LPF 0-5 0 - 5   Mucus PRESENT   I-Stat Beta hCG blood, ED ([facility], [facility], [facility] only)  Result Value Ref Range   I-stat hCG, quantitative <5.0 <5 mIU/mL   Comment 3           Ct Abdomen Pelvis W Contrast  Result Date: 05/03/2018 CLINICAL DATA:  Abdominal pain radiating to the rectum since 10:30 a.m. today with nausea. EXAM: CT ABDOMEN AND PELVIS WITH CONTRAST TECHNIQUE:  Multidetector CT imaging of the abdomen and pelvis was performed using the standard protocol following bolus administration of intravenous contrast. CONTRAST:  100mL OMNIPAQUE IOHEXOL 300 MG/ML  SOLN COMPARISON:  Lumbar spine MR dated 08/21/2017 and pelvis ultrasound dated 07/02/2017. FINDINGS: Lower chest: Unremarkable. Hepatobiliary: No focal liver abnormality is seen. No gallstones, gallbladder wall thickening, or biliary dilatation. Pancreas: Unremarkable. No pancreatic ductal dilatation or surrounding inflammatory changes. Spleen: Normal in size without focal abnormality. Adrenals/Urinary Tract: Adrenal glands are unremarkable. Kidneys are normal, without renal calculi, focal lesion, or hydronephrosis. Bladder is unremarkable. Stomach/Bowel: Stomach is within normal limits. Appendix appears normal. No evidence of bowel wall thickening, distention, or inflammatory changes. Vascular/Lymphatic: No significant vascular findings are present. No enlarged abdominal or pelvic lymph nodes. Reproductive: Uterus and bilateral adnexa are unremarkable. Other: Small to moderate amount of fluid in the inferior pelvis measuring up to 19 Hounsfield units in density. Musculoskeletal: Unremarkable bones. IMPRESSION: 1. Small to moderate amount of fluid in the inferior pelvis measuring up to 19 Hounsfield units in density. This could represent mildly hemorrhagic fluid from recent ovarian cyst rupture. Mildly infected or mildly proteinaceous fluid could also measure this density. 2. Otherwise, unremarkable abdomen and pelvis. Electronically Signed   By: Beckie SaltsSteven  Reid M.D.   On: 05/03/2018 16:35     Procedures  MDM  Work-up Reviewed:  CBC: Mild leukocytosis @ 12.2. No anemia CMP: fairly unremarkable UA: not consistent with UTI Wet prep: rare WBCs.  GC/chlamydia- pending.   CT abdomen/pelvis:  IMPRESSION: 1. Small to moderate amount of fluid in the inferior pelvis measuring up to 19 Hounsfield units in density. This  could represent mildly hemorrhagic fluid from recent ovarian cyst rupture. Mildly infected or mildly proteinaceous fluid could also measure this density. 2. Otherwise, unremarkable abdomen and pelvis.   Pelvic exam performed by prior provider did not have  significant discharge or CMT, however patient does have a hx of PID in the past. GC/chlamydia pending, feel that ovarian cyst rupture is more likely, but will cover for PID w/ Ceftriaxone & Doxycycline. NSAIDs for pain control. Obgyn follow up. I discussed results, treatment plan, need for follow-up, and return precautions. Provided opportunity for questions, patient & her significant other at bedside confirmed understanding and are in agreement with plan.   Findings and plan of care discussed with supervising physician Dr. Hyacinth MeekerMiller who is in agreement.   Interpretor utilized for translation throughout encounter.        Desmond Lopeetrucelli, Mylo Driskill R, PA-C 05/03/18 1739    Eber HongMiller, Brian, MD 05/03/18 [phone]

## 2018-05-03 NOTE — ED Provider Notes (Signed)
[facility] EM[facility] Provider Note   CSN: [id number] Arrival date & time: 05/03/18  1116   History   Chief Complaint Chief Complaint  Patient presents with  . Abdominal Pain    HPI Kayla BoysDora Stevenson is a 38 y.o. female.   HPI in-house Spanish translator used  38 year old female presents today with complaints of abdomen pelvic pain.  She notes tha over the last day she has had pain in the lower abdomen diffusely.  She notes this was only present when urinating, now having diffuse pain throughout the lower abdomen.  She denies any change in color or odor or any other characteristics of her urine.  She denies any vaginal discharge or bleeding.  She denies any fever nausea or vomiting.  She notes having similar symptoms in the past that have resolved on their own.    Past Medical History:  Diagnosis Date  . Anemia     Patient Active Problem List   Diagnosis Date Noted  . Acute midline low back pain without sciatica 09/14/2017  . Language barrier 07/07/2017  . Vitamin D deficiency 02/27/2015  . Pelvic pain in female 02/26/2015    Past Surgical History:  Procedure Laterality Date  . CESAREAN SECTION    . CESAREAN SECTION       OB History    Gravida  4   Para  2   Term  2   Preterm      AB      Living  2     SAB      TAB      Ectopic      Multiple      Live Births  2            Home Medications    Prior to Admission medications   Medication Sig Start Date End Date Taking? Authorizing Provider  cyclobenzaprine (FLEXERIL) 5 MG tablet Take 1 tablet (5 mg total) by mouth 2 (two) times daily as needed for muscle spasms. 09/14/17   Marcine MatarJohnson, Deborah B, MD  naproxen (NAPROSYN) 500 MG tablet Take 1 tablet (500 mg total) by mouth 2 (two) times daily as needed. Patient not taking: Reported on 09/14/2017 08/21/17   Ward, Chase PicketJaime Pilcher, PA-C  ondansetron (ZOFRAN ODT) 4 MG disintegrating tablet Take 1 tablet (4 mg total) by mouth every  8 (eight) hours as needed for nausea or vomiting. Patient not taking: Reported on 09/14/2017 08/21/17   Ward, Chase PicketJaime Pilcher, PA-C  traMADol (ULTRAM) 50 MG tablet Take 1 tablet (50 mg total) by mouth every 6 (six) hours as needed. Patient not taking: Reported on 09/14/2017 07/02/17   Liberty HandyGibbons, Claudia J, PA-C  traMADol (ULTRAM) 50 MG tablet Take 1 tablet (50 mg total) by mouth every 8 (eight) hours as needed. 09/14/17   Marcine MatarJohnson, Deborah B, MD    Family History No family history on file.  Social History Social History   Tobacco Use  . Smoking status: Never Smoker  . Smokeless tobacco: Never Used  Substance Use Topics  . Alcohol use: No    Alcohol/week: 0.0 standard drinks  . Drug use: No     Allergies   Patient has no known allergies.   Review of Systems Review of Systems  All other systems reviewed and are negative.   Physical Exam Updated Vital Signs BP 106/72 (BP Location: Right Arm)   Pulse 92   Temp 97.9 F (36.6 C) (Oral)   Resp 16   Ht 5\' 2"  (  1.575 m)   Wt 69.9 kg   LMP 04/21/2017   SpO2 100%   BMI 28.17 kg/m   Physical Exam Vitals signs and nursing note reviewed.  Constitutional:      Appearance: She is well-developed.  HENT:     Head: Normocephalic and atraumatic.  Eyes:     General: No scleral icterus.       Right eye: No discharge.        Left eye: No discharge.     Conjunctiva/sclera: Conjunctivae normal.     Pupils: Pupils are equal, round, and reactive to light.  Neck:     Musculoskeletal: Normal range of motion.     Vascular: No JVD.     Trachea: No tracheal deviation.  Pulmonary:     Effort: Pulmonary effort is normal.     Breath sounds: No stridor.  Abdominal:     Comments: Exquisite tenderness palpation of the lower abdomen diffusely  Genitourinary:    Comments: Small amount of white discharge in vaginal vault, no cervical motion tenderness, no adnexal masses noted Neurological:     Mental Status: She is alert and oriented to person,  place, and time.     Coordination: Coordination normal.  Psychiatric:        Behavior: Behavior normal.        Thought Content: Thought content normal.        Judgment: Judgment normal.     ED Treatments / Results  Labs (all labs ordered are listed, but only abnormal results are displayed) Labs Reviewed  WET PREP, GENITAL - Abnormal; Notable for the following components:      Result Value   WBC, Wet Prep HPF POC RARE (*)    All other components within normal limits  CBC WITH DIFFERENTIAL/PLATELET - Abnormal; Notable for the following components:   WBC 12.2 (*)    Neutro Abs 9.9 (*)    All other components within normal limits  COMPREHENSIVE METABOLIC PANEL - Abnormal; Notable for the following components:   Glucose, Bld 103 (*)    Total Protein 6.4 (*)    All other components within normal limits  URINALYSIS, ROUTINE W REFLEX MICROSCOPIC - Abnormal; Notable for the following components:   Hgb urine dipstick SMALL (*)    Bacteria, UA RARE (*)    All other components within normal limits  LIPASE, BLOOD  POC URINE PREG, ED  I-STAT BETA HCG BLOOD, ED ([facility], [facility], [facility] ONLY)  GC/CHLAMYDIA PROBE AMP ([place]) NOT AT [facility]    EKG None  Radiology No results found.  Procedures Procedures (including critical care time)  Medications Ordered in ED Medications  morphine 4 MG/ML injection 4 mg (4 mg Intravenous Given 05/03/18 1313)     Initial Impression / Assessment and Plan / ED Course  I have reviewed the triage vital signs and the nursing notes.  Pertinent labs & imaging results that were available during my care of the patient were reviewed by me and considered in my medical decision making (see chart for details).        Assessment/Plan: 38 year old female presenting with abdominal pain.  Patient notes pain with urination but no signs of urinary tract infection.  She does have exquisite tenderness to the lower abdomen.  This is not unilateral.  This was not abrupt, I  have very low suspicion for ovarian torsion.  Patient has no signs of pelvic inflammatory disease on my physical exam.  She will need CT scan to rule out any  acute life-threatening etiology including appendicitis.  Patient care signed to oncoming provider pending CT scan.   Final Clinical Impressions(s) / ED Diagnoses   Final diagnoses:  Abdominal pain, unspecified abdominal location    ED Discharge Orders    None       Rosalio Loud 05/03/18 1545    Eber Hong, MD 05/03/18 [phone]

## 2018-05-03 NOTE — ED Triage Notes (Signed)
Pt here for several abdominal pain, pt is doubled over in pain crying, pt needs eval in ER, wheeled to ER by tech.

## 2018-05-03 NOTE — Discharge Instructions (Addendum)
You were seen in the emergency department today for pelvic pain.  Your CT scan showed an area of fluid density in the pelvis (lower belly).  We are unsure exactly what caused this, it may be an ovarian cyst, or may be due to infection.  Send you home with naproxen to help treat pain that could be due to an ovarian cyst.  - Naproxen is a nonsteroidal anti-inflammatory medication that will help with pain and swelling. Be sure to take this medication as prescribed with food, 1 pill every 12 hours,  It should be taken with food, as it can cause stomach upset, and more seriously, stomach bleeding. Do not take other nonsteroidal anti-inflammatory medications with this such as Advil, Motrin, Aleve, Mobic, Goodie Powder, or Motrin.    - Doxycycline: We are also sending you home with doxycycline, an antibiotic to treat potential infection.   You make take Tylenol per over the counter dosing with these medications.   We have prescribed you new medication(s) today. Discuss the medications prescribed today with your pharmacist as they can have adverse effects and interactions with your other medicines including over the counter and prescribed medications. Seek medical evaluation if you start to experience new or abnormal symptoms after taking one of these medicines, seek care immediately if you start to experience difficulty breathing, feeling of your throat closing, facial swelling, or rash as these could be indications of a more serious allergic reaction.   We would like you to follow-up with women's health, we have provided follow-up information in your discharge instructions, please call to make an appointment within 3 to 5 days.  Return to the ER should you experience new or worsening symptoms including but not limited to fever, worsening pain, inability to keep fluids down, or any other concerns that you may have.    Google Translate:  Usted fue visto hoy en el departamento de emergencias por dolor plvico.  Su tomografa computarizada mostr un rea de densidad de lquido en la pelvis (parte inferior del abdomen). No estamos seguros de qu caus esto, puede ser un quiste ovrico o una infeccin. Enviarlo a casa con naproxeno para ayudar a tratar el dolor que podra deberse a un quiste ovrico. - El naproxeno es un medicamento antiinflamatorio no esteroideo que ayudar con Chief Technology Officer y la hinchazn. Asegrese de tomar [employer] segn lo prescrito con los alimentos, 1 pldora cada 12 horas. Debe tomarse con alimentos, ya que puede causar [employer] y, lo que es ms grave, sangrado estomacal. No tome otros medicamentos antiinflamatorios no esteroideos como Advil, Motrin, Aleve, Mobic, Goodie Powder o Motrin.  - Doxiciclina: tambin lo enviamos a su hogar con doxiciclina, un antibitico para tratar una posible infeccin.  Usted hace tomar Tylenol por dosis de venta libre con [employer].  Le hemos recetado nuevos medicamentos hoy. Discuta los medicamentos recetados hoy con su farmacutico, ya que pueden tener efectos adversos e interacciones con sus otros medicamentos, incluidos los de venta libre y los recetados. Busque evaluacin mdica si comienza a experimentar sntomas nuevos o anormales despus de tomar uno de estos medicamentos, busque atencin inmediata si comienza a experimentar dificultad para respirar, sensacin de cierre de garganta, hinchazn facial o erupcin cutnea, ya que estos podran ser indicios de un problema ms grave. reaccin alrgica.  Nos gustara que hiciera un seguimiento de la salud de la mujer, hemos proporcionado informacin de seguimiento en sus instrucciones de alta, llame para hacer una cita dentro de 3 a 5 das. Regrese  a la sala de emergencias si experimenta sntomas nuevos o que empeoran, que [place], entre [place], [place], empeoramiento del [place], incapacidad para [employer] lquidos o cualquier otra inquietud que pueda [place].

## 2018-05-04 LAB — GC/CHLAMYDIA PROBE AMP (~~LOC~~) NOT AT ARMC
Chlamydia: NEGATIVE
Neisseria Gonorrhea: NEGATIVE

## 2018-05-11 ENCOUNTER — Ambulatory Visit (INDEPENDENT_AMBULATORY_CARE_PROVIDER_SITE_OTHER): Payer: Self-pay | Admitting: Obstetrics & Gynecology

## 2018-05-11 VITALS — BP 128/86 | HR 84 | Wt 127.0 lb

## 2018-05-11 DIAGNOSIS — R102 Pelvic and perineal pain: Secondary | ICD-10-CM

## 2018-05-11 DIAGNOSIS — Z0000 Encounter for general adult medical examination without abnormal findings: Secondary | ICD-10-CM

## 2018-05-11 NOTE — Progress Notes (Signed)
   Subjective:    Patient ID: Kayla Stevenson, female    DOB: [birth year], 38 y.o.   MRN: [id number]  HPI 38 yo P2 (12 and 38 yo kids) here today for followup after a visit to the ER for pelvic pain. The pain is now resolved. She had a normal CT and negative cultures there.    Review of Systems Her last pap was normal 2 years ago. She is having her period today.    Objective:   Physical Exam Breathing, conversing, and ambulating normally Well nourished, well hydrated Latina, no apparent distress  Interpretor used for visit Abd- benign     Assessment & Plan:  I have rec'd that she go to the health dept for her pap smear I have discussed ovarian cysts and that they may recurr Fam med referral for preventative care

## 2019-07-31 ENCOUNTER — Encounter (HOSPITAL_COMMUNITY): Payer: Self-pay

## 2019-07-31 ENCOUNTER — Other Ambulatory Visit: Payer: Self-pay

## 2019-07-31 ENCOUNTER — Ambulatory Visit (HOSPITAL_COMMUNITY)
Admission: EM | Admit: 2019-07-31 | Discharge: 2019-07-31 | Disposition: A | Discharge status: Home / Self Care | Payer: Self-pay

## 2019-07-31 DIAGNOSIS — N6452 Nipple discharge: Secondary | ICD-10-CM | POA: Insufficient documentation

## 2019-07-31 DIAGNOSIS — Z3202 Encounter for pregnancy test, result negative: Secondary | ICD-10-CM

## 2019-07-31 DIAGNOSIS — N643 Galactorrhea not associated with childbirth: Secondary | ICD-10-CM | POA: Insufficient documentation

## 2019-07-31 LAB — POC URINE PREG, ED: Preg Test, Ur: NEGATIVE

## 2019-07-31 NOTE — ED Triage Notes (Signed)
Pt is here with left breast jello discharge started a week ago(she had this 2 years ago)/nausea for 2 weeks now after brushing her teeth. Pt has taken to relieve discomfort.

## 2019-07-31 NOTE — ED Provider Notes (Signed)
[place]   MRN: [id number] DOB: Jul 31, 1980  Subjective:   Kayla Stevenson is a 39 y.o. female presenting for 1 week hx of recurrent left nipple discharge that is thick and yellow. Has some white spots as well. Denies pain. Had a mammogram done 2 years ago when this first started but it was normal. Has not had a recurrence until this episode.  LMP was this past week, stopped yesterday.   No current facility-administered medications for this encounter.  Current Outpatient Medications:  .  vitamin B-12 (CYANOCOBALAMIN) 100 MCG tablet, Take 100 mcg by mouth daily., Disp: , Rfl:    No Known Allergies  Past Medical History:  Diagnosis Date  . Anemia      Past Surgical History:  Procedure Laterality Date  . CESAREAN SECTION    . CESAREAN SECTION      Family History  Problem Relation Age of Onset  . Healthy Mother   . Healthy Father     Social History   Tobacco Use  . Smoking status: Never Smoker  . Smokeless tobacco: Never Used  Substance Use Topics  . Alcohol use: No    Alcohol/week: 0.0 standard drinks  . Drug use: No    ROS   Objective:   Vitals: BP 116/76 (BP Location: Right Arm)   Pulse 68   Temp 98.1 F (36.7 C) (Oral)   Resp 16   Wt 139 lb (63 kg)   LMP 07/27/2019   SpO2 100%   BMI 25.42 kg/m   Physical Exam Exam conducted with a chaperone present Journalist, newspaper).  Constitutional:      General: She is not in acute distress.    Appearance: Normal appearance. She is well-developed. She is not ill-appearing, toxic-appearing or diaphoretic.  HENT:     Head: Normocephalic and atraumatic.     Nose: Nose normal.     Mouth/Throat:     Mouth: Mucous membranes are moist.  Eyes:     Extraocular Movements: Extraocular movements intact.     Pupils: Pupils are equal, round, and reactive to light.  Cardiovascular:     Rate and Rhythm: Normal rate and regular rhythm.     Pulses: Normal pulses.     Heart sounds: Normal heart sounds. No murmur. No  friction rub. No gallop.   Pulmonary:     Effort: Pulmonary effort is normal. No respiratory distress.     Breath sounds: Normal breath sounds. No stridor. No wheezing, rhonchi or rales.  Chest:     Chest wall: No mass, lacerations, deformity, swelling, tenderness, crepitus or edema.     Breasts:        Right: No swelling, bleeding, inverted nipple, mass, nipple discharge, skin change or tenderness.        Left: Nipple discharge present. No swelling, bleeding, inverted nipple, mass, skin change or tenderness.  Lymphadenopathy:     Upper Body:     Right upper body: No supraclavicular, axillary or pectoral adenopathy.     Left upper body: No supraclavicular, axillary or pectoral adenopathy.  Skin:    General: Skin is warm and dry.     Findings: No rash.  Neurological:     Mental Status: She is alert and oriented to person, place, and time.  Psychiatric:        Mood and Affect: Mood normal.        Behavior: Behavior normal.        Thought Content: Thought content normal.    Results for  orders placed or performed during the hospital encounter of 07/31/19 (from the past 24 hour(s))  POC urine pregnancy     Status: None   Collection Time: 07/31/19  3:39 PM  Result Value Ref Range   Preg Test, Ur NEGATIVE NEGATIVE   Assessment and Plan :   PDMP not reviewed this encounter.  1. Nipple discharge   2. Galactorrhea     Prolactin level pending, patient needs repeat diagnostic mammogram with or without ultrasound as deemed necessary by the breast center. Order provided to the patient for this. Otherwise, f/u with PCP. Counseled patient on potential for adverse effects with medications prescribed/recommended today, ER and return-to-clinic precautions discussed, patient verbalized understanding.    Wallis Bamberg, [place] 07/31/19 [phone]

## 2019-08-01 LAB — PROLACTIN: Prolactin: 22.5 ng/mL (ref 4.8–23.3)

## 2019-09-11 ENCOUNTER — Ambulatory Visit: Payer: Self-pay | Admitting: Internal Medicine

## 2020-03-14 IMAGING — CT CT ABD-PELV W/ CM
2 of 4 series · 15 of 46 positions shown, 17 images · IV contrast (APPLIED)
Comparison: Lumbar spine MR dated 08/21/2017 and pelvis ultrasound
dated 07/02/2017.

CLINICAL DATA: Abdominal pain radiating to the rectum since [DATE]
a.m. today with nausea.

EXAM:
CT ABDOMEN AND PELVIS WITH CONTRAST
TECHNIQUE: Multidetector CT imaging of the abdomen and pelvis was performed
using the standard protocol following bolus administration of
intravenous contrast.
CONTRAST:  100mL OMNIPAQUE IOHEXOL 300 MG/ML  SOLN

[Series 3: abd/ pelvis 5.0 i30f 2 · axial · 0.73mm/px · z∈[+774,+1198]mm · 12 of 97 slices shown, 14 images]
[im 8/97  soft-tissue]
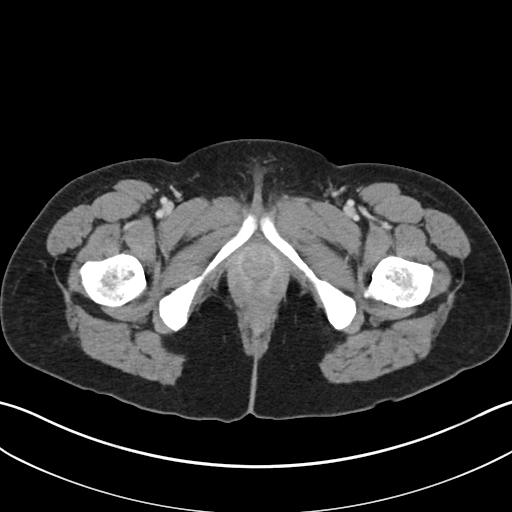
[im 8/97  bone]
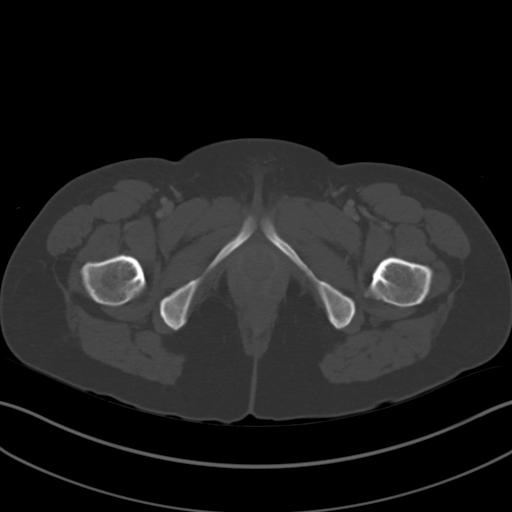
[im 16/97  soft-tissue]
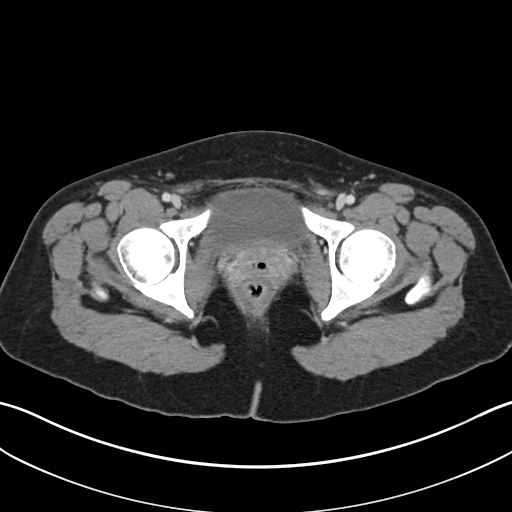
[im 24/97  soft-tissue]
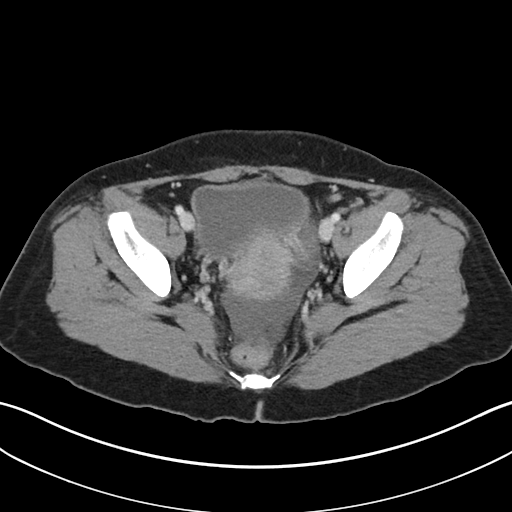
[im 31/97  soft-tissue]
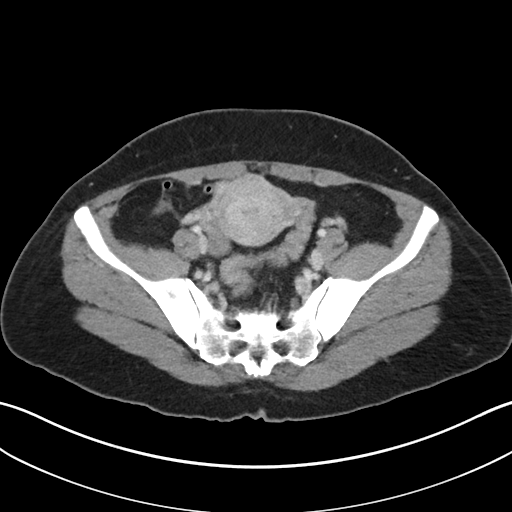
[im 39/97  soft-tissue]
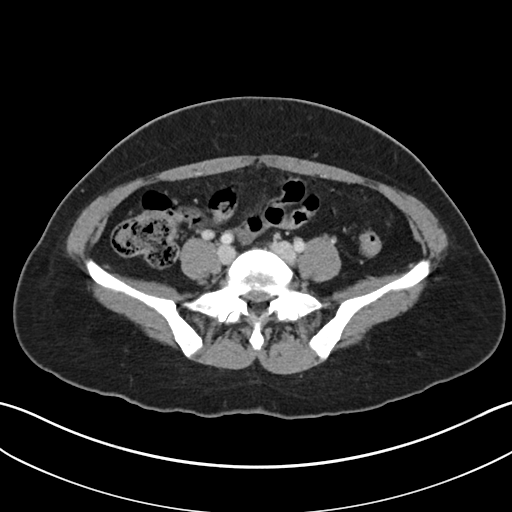
[im 47/97  soft-tissue]
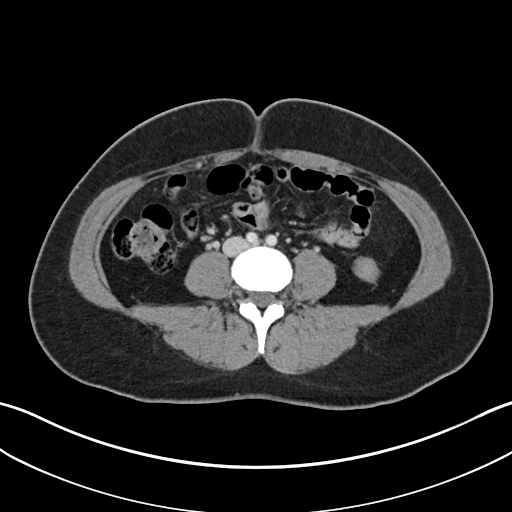
[im 54/97  soft-tissue]
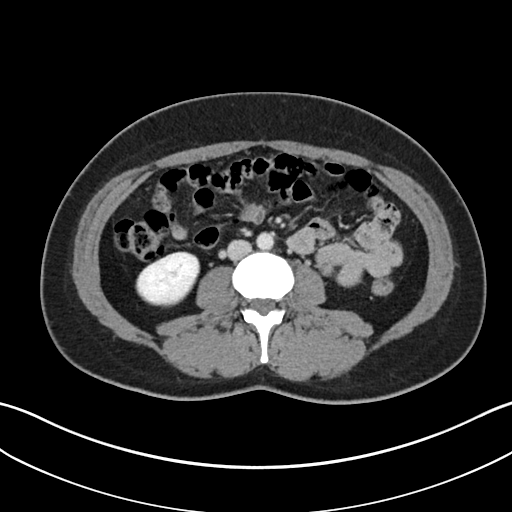
[im 62/97  soft-tissue]
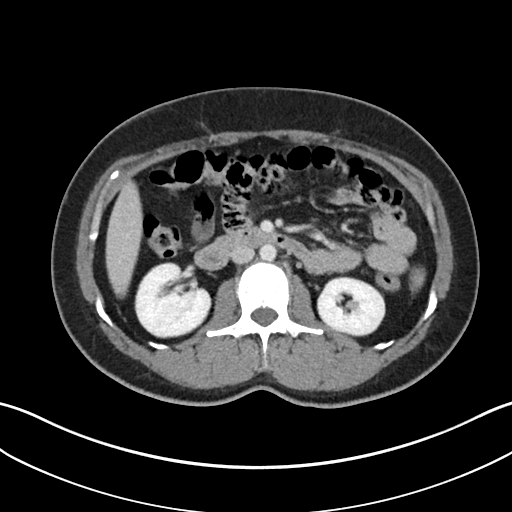
[im 70/97  soft-tissue]
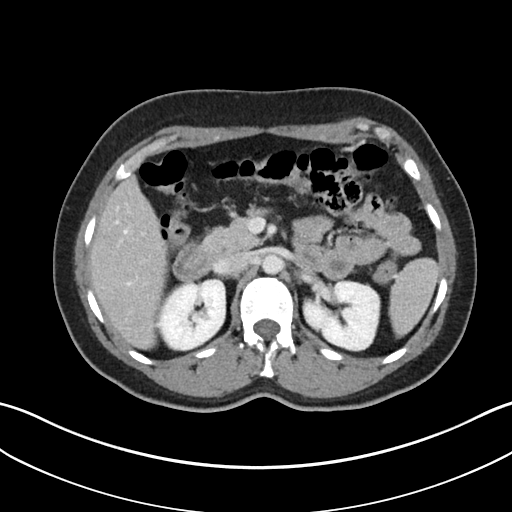
[im 70/97  bone]
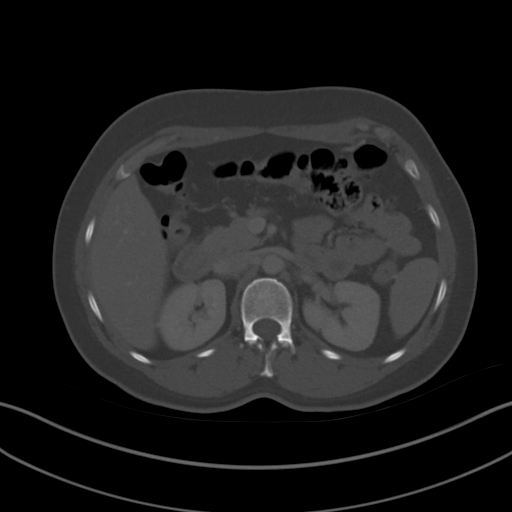
[im 77/97  soft-tissue]
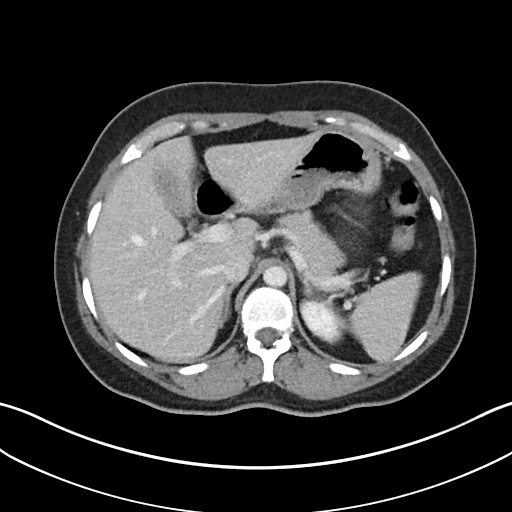
[im 85/97  soft-tissue]
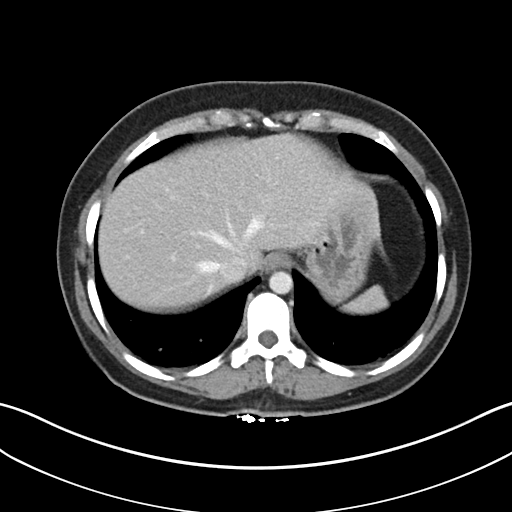
[im 93/97  soft-tissue]
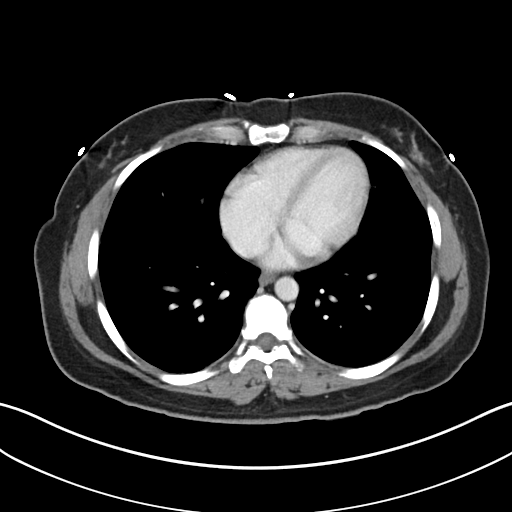

[Series 6: coronal soft tissue · coronal · 0.64mm/px · 3 of 84 slices shown]
[im 28/84  soft-tissue]
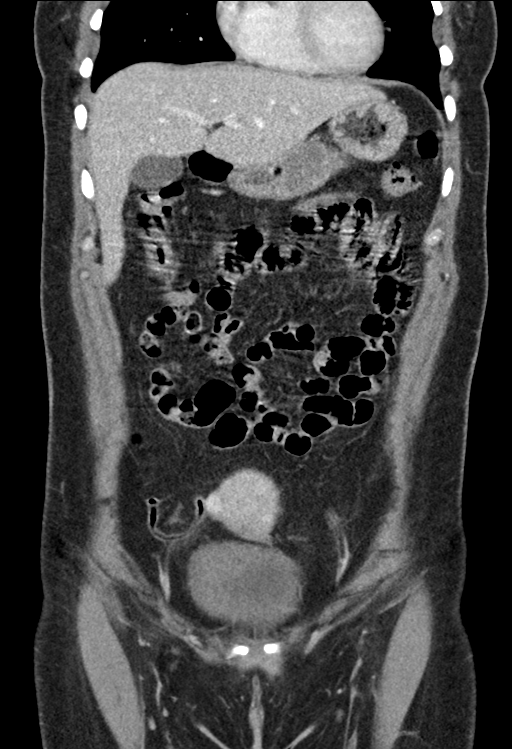
[im 37/84  soft-tissue]
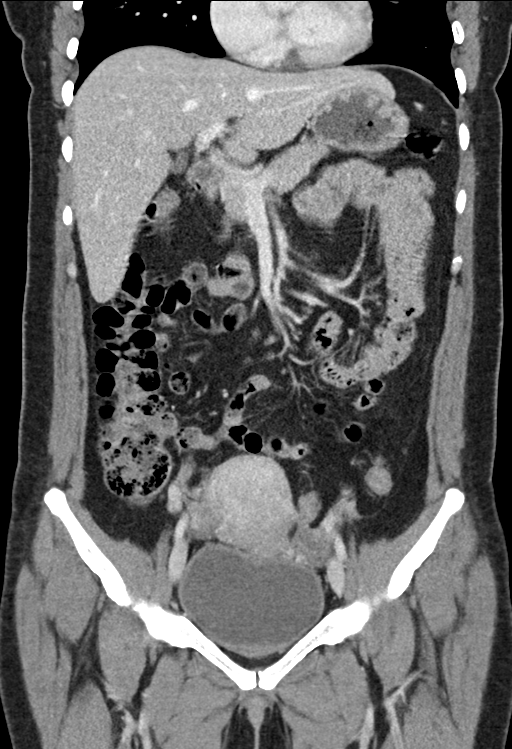
[im 47/84  soft-tissue]
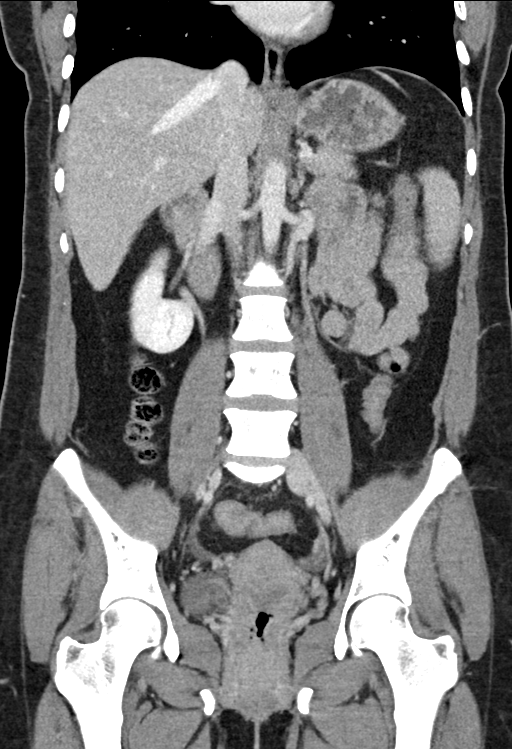

[15 of 46 positions shown; findings below may reference images not displayed]

FINDINGS: Lower chest: Unremarkable.

Hepatobiliary: No focal liver abnormality is seen. No gallstones,
gallbladder wall thickening, or biliary dilatation.

Pancreas: Unremarkable. No pancreatic ductal dilatation or
surrounding inflammatory changes.

Spleen: Normal in size without focal abnormality.

Adrenals/Urinary Tract: Adrenal glands are unremarkable. Kidneys are
normal, without renal calculi, focal lesion, or hydronephrosis.
Bladder is unremarkable.

Stomach/Bowel: Stomach is within normal limits. Appendix appears
normal. No evidence of bowel wall thickening, distention, or
inflammatory changes.

Vascular/Lymphatic: No significant vascular findings are present. No
enlarged abdominal or pelvic lymph nodes.

Reproductive: Uterus and bilateral adnexa are unremarkable.

Other: Small to moderate amount of fluid in the inferior pelvis
measuring up to 19 Hounsfield units in density.

Musculoskeletal: Unremarkable bones.
IMPRESSION: 1. Small to moderate amount of fluid in the inferior pelvis
measuring up to 19 Hounsfield units in density. This could represent
mildly hemorrhagic fluid from recent ovarian cyst rupture. Mildly
infected or mildly proteinaceous fluid could also measure this
density.
2. Otherwise, unremarkable abdomen and pelvis.

## 2021-01-28 ENCOUNTER — Ambulatory Visit: Payer: Self-pay | Admitting: Internal Medicine

## 2021-03-25 ENCOUNTER — Ambulatory Visit: Payer: Self-pay | Admitting: Internal Medicine

## 2021-05-19 ENCOUNTER — Ambulatory Visit: Payer: Self-pay | Attending: Internal Medicine | Admitting: Internal Medicine

## 2021-05-19 ENCOUNTER — Other Ambulatory Visit (HOSPITAL_COMMUNITY)
Admission: RE | Admit: 2021-05-19 | Discharge: 2021-05-19 | Disposition: A | Discharge status: Home / Self Care | Payer: Self-pay | Source: Ambulatory Visit | Attending: Internal Medicine | Admitting: Internal Medicine

## 2021-05-19 ENCOUNTER — Encounter: Payer: Self-pay | Admitting: Internal Medicine

## 2021-05-19 ENCOUNTER — Other Ambulatory Visit: Payer: Self-pay

## 2021-05-19 VITALS — BP 101/61 | HR 77 | Resp 16 | Ht <= 58 in | Wt 125.2 lb

## 2021-05-19 DIAGNOSIS — Z114 Encounter for screening for human immunodeficiency virus [HIV]: Secondary | ICD-10-CM

## 2021-05-19 DIAGNOSIS — N920 Excessive and frequent menstruation with regular cycle: Secondary | ICD-10-CM

## 2021-05-19 DIAGNOSIS — N644 Mastodynia: Secondary | ICD-10-CM

## 2021-05-19 DIAGNOSIS — F320 Major depressive disorder, single episode, mild: Secondary | ICD-10-CM | POA: Insufficient documentation

## 2021-05-19 DIAGNOSIS — R634 Abnormal weight loss: Secondary | ICD-10-CM | POA: Insufficient documentation

## 2021-05-19 DIAGNOSIS — Z124 Encounter for screening for malignant neoplasm of cervix: Secondary | ICD-10-CM | POA: Insufficient documentation

## 2021-05-19 DIAGNOSIS — Z0000 Encounter for general adult medical examination without abnormal findings: Secondary | ICD-10-CM

## 2021-05-19 DIAGNOSIS — F411 Generalized anxiety disorder: Secondary | ICD-10-CM | POA: Insufficient documentation

## 2021-05-19 DIAGNOSIS — Z23 Encounter for immunization: Secondary | ICD-10-CM

## 2021-05-19 NOTE — Progress Notes (Signed)
Patient ID: Kayla Stevenson, female    DOB: 1981-03-05  MRN: [id number]  CC: Annual Exam and Gynecologic Exam   Subjective: Kayla Stevenson is a 41 y.o. female who presents for annual exam Her concerns today include:   GYN History:  Pt is G3P2 (one miscarriage) Any hx of abn paps?: no.  Last pap was at [facility] Menses regular or irregular?: LNMP was last wk How long does menses last? For 2-4 yrs Menstrual flow light or heavy?: for past 2-4 yrs menses were coming and lasting 2 wks with heavy bleeding. Moderate bleeding, now lasting 3 days since being on BCP for past 6-7 mth Method of birth control?:  had IUD but had to have it removed.  Starting taking BCP 6-7 mths ago through [facility].  Marland Kitchen  Any vaginal dischg at this time?: no Dysuria?: no Any hx of STI?:  yes, Chlamydia Sexually active with how many partners:  one female Desires STI screen:  yes Last MMG:  NA.  C/o some burning in LT breast x past 3-4 wks Family hx of uterine, cervical or breast cancer?:  no  Lossing wgh x 1 yr unintentional Eating 3 meals/day Endorses frequent urination and polydipsia.  No blurred vision. No fhx of diabetes  Dep/Anx: Was having a lot of anxiety during the pandemic.  She was placed on medication for it but decided to stop taking it.  Seeing counselor x 1 yr.  She sees a Social worker via telehealth sometimes once a week sometimes once a month.  She finds the sessions helpful.  She takes some B vitamins which are also helpful. Patient Active Problem List   Diagnosis Date Noted   GAD (generalized anxiety disorder) 05/19/2021   Major depressive disorder, single episode, mild ([place]) 05/19/2021   Unexplained weight loss 05/19/2021   Acute midline low back pain without sciatica 09/14/2017   Language barrier 07/07/2017   Vitamin D deficiency 02/27/2015   Pelvic pain in female 02/26/2015     Current Outpatient Medications on File Prior to Visit  Medication Sig Dispense Refill   vitamin B-12 (CYANOCOBALAMIN) 100  MCG tablet Take 100 mcg by mouth daily.     No current facility-administered medications on file prior to visit.    No Known Allergies  Social History   Socioeconomic History   Marital status: Single    Spouse name: Not on file   Number of children: Not on file   Years of education: Not on file   Highest education level: Not on file  Occupational History   Not on file  Tobacco Use   Smoking status: Never   Smokeless tobacco: Never  Vaping Use   Vaping Use: Never used  Substance and Sexual Activity   Alcohol use: No    Alcohol/week: 0.0 standard drinks   Drug use: No   Sexual activity: Yes    Birth control/protection: I.U.D.  Other Topics Concern   Not on file  Social History Narrative   Not on file   Social Determinants of Health   Financial Resource Strain: Not on file  Food Insecurity: Not on file  Transportation Needs: Not on file  Physical Activity: Not on file  Stress: Not on file  Social Connections: Not on file  Intimate Partner Violence: Not on file    Family History  Problem Relation Age of Onset   Healthy Mother    Healthy Father     Past Surgical History:  Procedure Laterality Date   CESAREAN SECTION  CESAREAN SECTION      ROS: Review of Systems  HENT:  Negative for hearing loss.   Eyes:  Negative for visual disturbance.  Respiratory:  Negative for cough and shortness of breath.   Cardiovascular:  Negative for chest pain and palpitations.  Gastrointestinal:  Negative for abdominal pain.  Genitourinary:  Negative for difficulty urinating.  Negative except as stated above  PHYSICAL EXAM: BP 101/61    Pulse 77    Resp 16    Ht 4\' 9"  (1.448 m)    Wt 125 lb 3.2 oz (56.8 kg)    SpO2 100%    BMI 27.09 kg/m   Physical Exam  General appearance - alert, well appearing, middle-aged Hispanic female and in no distress Mental status - normal mood, behavior, speech, dress, motor activity, and thought processes Eyes - pupils equal and reactive,  extraocular eye movements intact Ears - bilateral TM's and external ear canals normal Nose - normal and patent, no erythema, discharge or polyps Mouth - mucous membranes moist, pharynx normal without lesions Neck - supple, no significant adenopathy Lymphatics - no palpable lymphadenopathy, no hepatosplenomegaly Chest - clear to auscultation, no wheezes, rales or rhonchi, symmetric air entry Heart - normal rate, regular rhythm, normal S1, S2, no murmurs, rubs, clicks or gallops Abdomen - soft, nontender, nondistended, no masses or organomegaly Breasts -CMA Pollock present for breast and pelvic exam.  No abnormal masses appreciated.  No nipple discharge. Pelvic - normal external genitalia, vulva, vagina, cervix, uterus and adnexa Extremities - peripheral pulses normal, no pedal edema, no clubbing or cyanosis Skin - normal coloration and turgor, no rashes, no suspicious skin lesions noted Hypopigmented birth mark RT side neck  Depression screen [facility] 2/9 05/19/2021 10/20/2017 09/14/2017  Decreased Interest 1 0 0  Down, Depressed, Hopeless 1 0 0  PHQ - 2 Score 2 0 0  Altered sleeping 1 1 -  Tired, decreased energy 1 1 -  Change in appetite - 1 -  Feeling bad or failure about yourself  1 0 -  Trouble concentrating 0 0 -  Moving slowly or fidgety/restless 0 0 -  Suicidal thoughts 1 0 -  PHQ-9 Score 6 3 -   GAD 7 : Generalized Anxiety Score 05/19/2021 10/20/2017 09/14/2017 07/07/2017  Nervous, Anxious, on Edge 0 0 0 1  Control/stop worrying 0 1 0 0  Worry too much - different things 1 0 1 1  Trouble relaxing 1 0 1 1  Restless 0 0 (No Data) 1  Easily annoyed or irritable 0 0 0 0  Afraid - awful might happen 1 0 3 0  Total GAD 7 Score 3 1 - 4     CMP Latest Ref Rng & Units 05/03/2018 07/01/2017  Glucose 70 - 99 mg/dL 103(H) 150(H)  BUN 6 - 20 mg/dL 14 14  Creatinine 0.44 - 1.00 mg/dL 0.61 0.61  Sodium 135 - 145 mmol/L 136 136  Potassium 3.5 - 5.1 mmol/L 4.3 3.6  Chloride 98 - 111 mmol/L 105  104  CO2 22 - 32 mmol/L 22 22  Calcium 8.9 - 10.3 mg/dL 9.2 9.3  Total Protein 6.5 - 8.1 g/dL 6.4(L) 6.6  Total Bilirubin 0.3 - 1.2 mg/dL 1.0 0.4  Alkaline Phos 38 - 126 U/L 82 62  AST 15 - 41 U/L 24 20  ALT 0 - 44 U/L 23 22   Lipid Panel  No results found for: CHOL, TRIG, HDL, CHOLHDL, VLDL, LDLCALC, LDLDIRECT  CBC    Component Value  Date/Time   WBC 12.2 (H) 05/03/2018 1310   RBC 4.82 05/03/2018 1310   HGB 14.7 05/03/2018 1310   HGB 14.1 10/20/2017 1506   HCT 41.9 05/03/2018 1310   HCT 40.4 10/20/2017 1506   PLT 259 05/03/2018 1310   PLT 273 10/20/2017 1506   MCV 86.9 05/03/2018 1310   MCV 88 10/20/2017 1506   MCH 30.5 05/03/2018 1310   MCHC 35.1 05/03/2018 1310   RDW 12.8 05/03/2018 1310   RDW 13.8 10/20/2017 1506   LYMPHSABS 1.4 05/03/2018 1310   LYMPHSABS 2.2 10/20/2017 1506   MONOABS 0.7 05/03/2018 1310   EOSABS 0.1 05/03/2018 1310   EOSABS 0.1 10/20/2017 1506   BASOSABS 0.0 05/03/2018 1310   BASOSABS 0.0 10/20/2017 1506    ASSESSMENT AND PLAN: 1. Annual physical exam Healthy eating habits and regular exercise discussed and encouraged. - Lipid panel  2. Pap smear for cervical cancer screening - Cervicovaginal ancillary only - Cytology - PAP  3. Menorrhagia with regular cycle This resolved once she was placed on birth control pills.  Uterus felt normal size on exam today.  4. Breast pain, left - MM Digital Diagnostic Unilat L; Future  5. Unexplained weight loss - TSH+T4F+T3Free - Hemoglobin A1c - CBC - Comprehensive metabolic panel - HIV antibody (with reflex)  6. Major depressive disorder, single episode, mild ([place]) 7. GAD (generalized anxiety disorder) Patient feels she is doing much better with counseling/therapy.  She declines medication to help with this  8. Screening for HIV (human immunodeficiency virus) - HIV antibody (with reflex)  9. Need for Tdap vaccination Given today.  [employer] Language interpreter used during this encounter. #Natalia  [id number]  Patient was given the opportunity to ask questions.  Patient verbalized understanding of the plan and was able to repeat key elements of the plan.   Orders Placed This Encounter  Procedures   MM Digital Diagnostic Unilat L   TSH+T4F+T3Free   Hemoglobin A1c   CBC   Comprehensive metabolic panel   Lipid panel   HIV antibody (with reflex)     Requested Prescriptions    No prescriptions requested or ordered in this encounter    Return in about 6 weeks (around 06/30/2021) for wgh loss.  Karle Plumber, MD, FACP

## 2021-05-20 LAB — COMPREHENSIVE METABOLIC PANEL
ALT: 15 IU/L (ref 0–32)
AST: 14 IU/L (ref 0–40)
Albumin/Globulin Ratio: 1.7 (ref 1.2–2.2)
Albumin: 4.1 g/dL (ref 3.8–4.8)
Alkaline Phosphatase: 72 IU/L (ref 44–121)
BUN/Creatinine Ratio: 15 (ref 9–23)
BUN: 9 mg/dL (ref 6–24)
Bilirubin Total: 0.2 mg/dL (ref 0.0–1.2)
CO2: 22 mmol/L (ref 20–29)
Calcium: 9.6 mg/dL (ref 8.7–10.2)
Chloride: 103 mmol/L (ref 96–106)
Creatinine, Ser: 0.61 mg/dL (ref 0.57–1.00)
Globulin, Total: 2.4 g/dL (ref 1.5–4.5)
Glucose: 87 mg/dL (ref 70–99)
Potassium: 3.9 mmol/L (ref 3.5–5.2)
Sodium: 138 mmol/L (ref 134–144)
Total Protein: 6.5 g/dL (ref 6.0–8.5)
eGFR: 116 mL/min/{1.73_m2} (ref >59)

## 2021-05-20 LAB — LIPID PANEL
Chol/HDL Ratio: 2.2 ratio (ref 0.0–4.4)
Cholesterol, Total: 142 mg/dL (ref 100–199)
HDL: 65 mg/dL (ref >39)
LDL Chol Calc (NIH): 45 mg/dL (ref 0–99)
Triglycerides: 202 mg/dL — ABNORMAL HIGH (ref 0–149)
VLDL Cholesterol Cal: 32 mg/dL (ref 5–40)

## 2021-05-20 LAB — TSH+T4F+T3FREE
Free T4: 1.21 ng/dL (ref 0.82–1.77)
T3, Free: 2.8 pg/mL (ref 2.0–4.4)
TSH: 1.29 u[IU]/mL (ref 0.450–4.500)

## 2021-05-20 LAB — CBC
Hematocrit: 42.1 % (ref 34.0–46.6)
Hemoglobin: 14.3 g/dL (ref 11.1–15.9)
MCH: 30.5 pg (ref 26.6–33.0)
MCHC: 34 g/dL (ref 31.5–35.7)
MCV: 90 fL (ref 79–97)
Platelets: 271 10*3/uL (ref 150–450)
RBC: 4.69 x10E6/uL (ref 3.77–5.28)
RDW: 13 % (ref 11.7–15.4)
WBC: 7.2 10*3/uL (ref 3.4–10.8)

## 2021-05-20 LAB — CERVICOVAGINAL ANCILLARY ONLY
Bacterial Vaginitis (gardnerella): NEGATIVE
Candida Glabrata: NEGATIVE
Candida Vaginitis: NEGATIVE
Chlamydia: NEGATIVE
Comment: NEGATIVE
Comment: NEGATIVE
Comment: NEGATIVE
Comment: NEGATIVE
Comment: NEGATIVE
Comment: NORMAL
Neisseria Gonorrhea: NEGATIVE
Trichomonas: NEGATIVE

## 2021-05-20 LAB — HIV ANTIBODY (ROUTINE TESTING W REFLEX): HIV Screen 4th Generation wRfx: NONREACTIVE

## 2021-05-20 LAB — HEMOGLOBIN A1C
Est. average glucose Bld gHb Est-mCnc: 117 mg/dL
Hgb A1c MFr Bld: 5.7 % — ABNORMAL HIGH (ref 4.8–5.6)

## 2021-05-20 NOTE — Progress Notes (Signed)
Patient in range for prediabetes.  Healthy eating habits will help prevent progression to full diabetes.  Cholesterol levels okay.  Thyroid level normal.  Blood cell counts normal.  Kidney and liver function test normal.  Screen for HIV is negative.

## 2021-05-21 LAB — CYTOLOGY - PAP
Adequacy: ABSENT
Comment: NEGATIVE
Diagnosis: NEGATIVE
High risk HPV: NEGATIVE

## 2021-05-22 ENCOUNTER — Telehealth: Payer: Self-pay

## 2021-05-22 NOTE — Telephone Encounter (Signed)
[employer] interpre[employer] [place]  Id# [phone]  contacted pt to go over lab results pt is aware and doesn't have any questions or concerns

## 2021-07-01 ENCOUNTER — Ambulatory Visit: Payer: Self-pay | Attending: Internal Medicine | Admitting: Internal Medicine

## 2021-07-01 ENCOUNTER — Other Ambulatory Visit (HOSPITAL_COMMUNITY)
Admission: RE | Admit: 2021-07-01 | Discharge: 2021-07-01 | Disposition: A | Discharge status: Home / Self Care | Payer: Self-pay | Source: Ambulatory Visit | Attending: Internal Medicine | Admitting: Internal Medicine

## 2021-07-01 ENCOUNTER — Encounter: Payer: Self-pay | Admitting: Internal Medicine

## 2021-07-01 VITALS — BP 109/70 | HR 71 | Resp 16 | Wt 125.0 lb

## 2021-07-01 DIAGNOSIS — R109 Unspecified abdominal pain: Secondary | ICD-10-CM

## 2021-07-01 LAB — POCT URINALYSIS DIP (CLINITEK)
Bilirubin, UA: NEGATIVE
Blood, UA: NEGATIVE
Glucose, UA: NEGATIVE mg/dL
Ketones, POC UA: NEGATIVE mg/dL
Leukocytes, UA: NEGATIVE
Nitrite, UA: NEGATIVE
POC PROTEIN,UA: NEGATIVE
Spec Grav, UA: 1.01 (ref 1.010–1.025)
Urobilinogen, UA: 0.2 E.U./dL
pH, UA: 6 (ref 5.0–8.0)

## 2021-07-01 NOTE — Progress Notes (Signed)
? ? ?Patient ID: Kayla Stevenson, female    DOB: Jul 13, 1980  MRN: [id number] ? ?CC: Weight Loss (Follow up 6 weeks) and Flank Pain (Left side/Started 5 days ago/Pain come and goes /Pain scale 5 out of 10 ) ? ? ?Subjective: ?Kayla Stevenson is a 41 y.o. female who presents for f/u wgh loss ?Her concerns today include:  ?Pt with preDM ? ?Wgh has stayed stable since last visit. ?I went over labs with her.  HIV test negative, tsh, CMP and CBC nl.  Only found to have preDM. ? ?Today she c/o pain mid abdomen LT side x 5 days.  No initiating factors ?Intermittent lasting about 10 mins.  Rates it 5-6/10. Feels like when someone is wearing a binder.  No associated N/V.  Not affected by food.  No fever, dysuria, abn vaginal discgh.  Sexually active with one partner.  Had pap with STI screening 04/2021.  Moving bowels okay.  No blood in stools. ?Noticed frequent urination when she drinks water x 2 mths.  [place] feels dry.Drinks 2-3 cups of coffee a day.   ? ?Patient Active Problem List  ? Diagnosis Date Noted  ? GAD (generalized anxiety disorder) 05/19/2021  ? Major depressive disorder, single episode, mild ([facility]) 05/19/2021  ? Unexplained weight loss 05/19/2021  ? Acute midline low back pain without sciatica 09/14/2017  ? Language barrier 07/07/2017  ? Vitamin D deficiency 02/27/2015  ? Pelvic pain in female 02/26/2015  ?  ? ?Current Outpatient Medications on File Prior to Visit  ?Medication Sig Dispense Refill  ? vitamin B-12 (CYANOCOBALAMIN) 100 MCG tablet Take 100 mcg by mouth daily.    ? ?No current facility-administered medications on file prior to visit.  ? ? ?No Known Allergies ? ?Social History  ? ?Socioeconomic History  ? Marital status: Single  ?  Spouse name: Not on file  ? Number of children: Not on file  ? Years of education: Not on file  ? Highest education level: Not on file  ?Occupational History  ? Not on file  ?Tobacco Use  ? Smoking status: Never  ? Smokeless tobacco: Never  ?Vaping Use  ? Vaping Use: Never  used  ?Substance and Sexual Activity  ? Alcohol use: No  ?  Alcohol/week: 0.0 standard drinks  ? Drug use: No  ? Sexual activity: Yes  ?  Birth control/protection: I.U.D.  ?Other Topics Concern  ? Not on file  ?Social History Narrative  ? Not on file  ? ?Social Determinants of Health  ? ?Financial Resource Strain: Not on file  ?Food Insecurity: Not on file  ?Transportation Needs: Not on file  ?Physical Activity: Not on file  ?Stress: Not on file  ?Social Connections: Not on file  ?Intimate Partner Violence: Not on file  ? ? ?Family History  ?Problem Relation Age of Onset  ? Healthy Mother   ? Healthy Father   ? ? ?Past Surgical History:  ?Procedure Laterality Date  ? CESAREAN SECTION    ? CESAREAN SECTION    ? ? ?ROS: ?Review of Systems ?Negative except as stated above ? ?PHYSICAL EXAM: ?BP 109/70   Pulse 71   Resp 16   Wt 125 lb (56.7 kg)   SpO2 95%   BMI 27.05 kg/m?   ?Wt Readings from Last 3 Encounters:  ?07/01/21 125 lb (56.7 kg)  ?05/19/21 125 lb 3.2 oz (56.8 kg)  ?07/31/19 139 lb (63 kg)  ? ? ?Physical Exam ? ?General appearance - alert, well appearing, and in  no distress ?Mental status - normal mood, behavior, speech, dress, motor activity, and thought processes ?Mouth - mucous membranes moist, pharynx normal without lesions ?Chest - clear to auscultation, no wheezes, rales or rhonchi, symmetric air entry ?Heart - normal rate, regular rhythm, normal S1, S2, no murmurs, rubs, clicks or gallops ?Abdomen -normal bowel sounds, nondistended, soft, mild tenderness in the left mid abdomen without guarding or rebound.  No flank tenderness. ? ? ? ?  Latest Ref Rng & Units 05/19/2021  ? 11:10 AM 05/03/2018  ?  1:10 PM 07/01/2017  ?  5:00 PM  ?CMP  ?Glucose 70 - 99 mg/dL 87   161103   096150    ?BUN 6 - 24 mg/dL 9   14   14     ?Creatinine 0.57 - 1.00 mg/dL 0.450.61   4.090.61   8.110.61    ?Sodium 134 - 144 mmol/L 138   136   136    ?Potassium 3.5 - 5.2 mmol/L 3.9   4.3   3.6    ?Chloride 96 - 106 mmol/L 103   105   104    ?CO2 20 - 29  mmol/L 22   22   22     ?Calcium 8.7 - 10.2 mg/dL 9.6   9.2   9.3    ?Total Protein 6.0 - 8.5 g/dL 6.5   6.4   6.6    ?Total Bilirubin 0.0 - 1.2 mg/dL 0.2   1.0   0.4    ?Alkaline Phos 44 - 121 IU/L 72   82   62    ?AST 0 - 40 IU/L 14   24   20     ?ALT 0 - 32 IU/L 15   23   22     ? ?Lipid Panel  ?   ?Component Value Date/Time  ? CHOL 142 05/19/2021 1110  ? TRIG 202 (H) 05/19/2021 1110  ? HDL 65 05/19/2021 1110  ? CHOLHDL 2.2 05/19/2021 1110  ? LDLCALC 45 05/19/2021 1110  ? ? ?CBC ?   ?Component Value Date/Time  ? WBC 7.2 05/19/2021 1110  ? WBC 12.2 (H) 05/03/2018 1310  ? RBC 4.69 05/19/2021 1110  ? RBC 4.82 05/03/2018 1310  ? HGB 14.3 05/19/2021 1110  ? HCT 42.1 05/19/2021 1110  ? PLT 271 05/19/2021 1110  ? MCV 90 05/19/2021 1110  ? MCH 30.5 05/19/2021 1110  ? MCH 30.5 05/03/2018 1310  ? MCHC 34.0 05/19/2021 1110  ? MCHC 35.1 05/03/2018 1310  ? RDW 13.0 05/19/2021 1110  ? LYMPHSABS 1.4 05/03/2018 1310  ? LYMPHSABS 2.2 10/20/2017 1506  ? MONOABS 0.7 05/03/2018 1310  ? EOSABS 0.1 05/03/2018 1310  ? EOSABS 0.1 10/20/2017 1506  ? BASOSABS 0.0 05/03/2018 1310  ? BASOSABS 0.0 10/20/2017 1506  ? ?Results for orders placed or performed in visit on 07/01/21  ?POCT URINALYSIS DIP (CLINITEK)  ?Result Value Ref Range  ? Color, UA yellow yellow  ? Clarity, UA clear clear  ? Glucose, UA negative negative mg/dL  ? Bilirubin, UA negative negative  ? Ketones, POC UA negative negative mg/dL  ? Spec Grav, UA 1.010 1.010 - 1.025  ? Blood, UA negative negative  ? pH, UA 6.0 5.0 - 8.0  ? POC PROTEIN,UA negative negative, trace  ? Urobilinogen, UA 0.2 0.2 or 1.0 E.U./dL  ? Nitrite, UA Negative Negative  ? Leukocytes, UA Negative Negative  ? ? ?ASSESSMENT AND PLAN: ?1. Abdominal pain, unspecified abdominal location ?Differential diagnoses include mild diverticulitis versus musculoskeletal in  nature.  UA negative for UTI.  I had her do a self vaginal swab.  Recommend that she push fluids and try taking Tylenol as needed.  If no improvement  or any worsening over the next several days, she should return for evaluation or be seen in the emergency room. ?- POCT URINALYSIS DIP (CLINITEK) ?- Cervicovaginal ancillary only ? ?2.  Weight has remained stable since last visit. ?No further work-up at this time. ? ?[employer] Language interpreter used during this encounter. #[id number] Dumas ? ?Patient was given the opportunity to ask questions.  Patient verbalized understanding of the plan and was able to repeat key elements of the plan.  ? ?This documentation was completed using Paediatric nurse.  Any transcriptional errors are unintentional. ? ?No orders of the defined types were placed in this encounter. ? ? ? ?Requested Prescriptions  ? ? No prescriptions requested or ordered in this encounter  ? ? ?No follow-ups on file. ? ?Jonah Blue, MD, FACP ?

## 2021-07-02 LAB — CERVICOVAGINAL ANCILLARY ONLY
Chlamydia: NEGATIVE
Comment: NEGATIVE
Comment: NEGATIVE
Comment: NORMAL
Neisseria Gonorrhea: NEGATIVE
Trichomonas: NEGATIVE

## 2021-07-07 ENCOUNTER — Telehealth: Payer: Self-pay

## 2021-07-07 NOTE — Telephone Encounter (Signed)
Contacted pt to go over lab results pt is aware and doesn't have any questions or concerns 

## 2023-01-05 ENCOUNTER — Other Ambulatory Visit: Payer: Self-pay | Admitting: Obstetrics & Gynecology

## 2023-01-05 DIAGNOSIS — Z1231 Encounter for screening mammogram for malignant neoplasm of breast: Secondary | ICD-10-CM

## 2023-01-28 ENCOUNTER — Ambulatory Visit
Admission: RE | Admit: 2023-01-28 | Discharge: 2023-01-28 | Disposition: A | Discharge status: Home / Self Care | Payer: No Typology Code available for payment source | Source: Ambulatory Visit | Attending: Obstetrics & Gynecology | Admitting: Obstetrics & Gynecology

## 2023-01-28 DIAGNOSIS — Z1231 Encounter for screening mammogram for malignant neoplasm of breast: Secondary | ICD-10-CM
